# Patient Record
Sex: Female | Born: 1970 | Race: White | Hispanic: No | Marital: Single
Health system: Midwestern US, Academic
[De-identification: ages and names within clinical notes are randomized; demographics above are authoritative.]

---

## 2016-10-08 MED ORDER — PROPOFOL INJ 10 MG/ML IV VIAL
0 refills | Status: DC
Start: 2016-10-08 — End: 2016-10-08

## 2016-10-08 MED ORDER — MIDAZOLAM 1 MG/ML IJ SOLN
INTRAVENOUS | 0 refills | Status: DC
Start: 2016-10-08 — End: 2016-10-08

## 2016-10-08 MED ORDER — RP DX TC-99M SULF COLL MCI
.5 | Freq: Once | INTRAVENOUS | 0 refills | Status: CP
Start: 2016-10-08 — End: ?

## 2016-10-08 MED ORDER — LIDOCAINE (PF) 200 MG/10 ML (2 %) IJ SYRG
0 refills | Status: DC
Start: 2016-10-08 — End: 2016-10-08

## 2016-10-08 MED ORDER — FENTANYL CITRATE (PF) 50 MCG/ML IJ SOLN
0 refills | Status: DC
Start: 2016-10-08 — End: 2016-10-08

## 2016-10-08 MED ORDER — DEXAMETHASONE SODIUM PHOSPHATE 10 MG/ML IJ SOLN
0 refills | Status: DC
Start: 2016-10-08 — End: 2016-10-08

## 2016-10-08 MED ORDER — ONDANSETRON HCL (PF) 4 MG/2 ML IJ SOLN
0 refills | Status: DC
Start: 2016-10-08 — End: 2016-10-08

## 2016-10-08 MED ORDER — FAMOTIDINE (PF) 20 MG/2 ML IV SOLN
0 refills | Status: DC
Start: 2016-10-08 — End: 2016-10-08

## 2016-10-08 MED ORDER — DIPHENHYDRAMINE HCL 50 MG/ML IJ SOLN
0 refills | Status: DC
Start: 2016-10-08 — End: 2016-10-08

## 2016-10-18 MED ORDER — DIAZEPAM 5 MG PO TAB
5 mg | ORAL_TABLET | ORAL | 0 refills | 7.00000 days | Status: DC | PRN
Start: 2016-10-18 — End: 2016-10-30

## 2016-10-18 MED ORDER — OXYCODONE-ACETAMINOPHEN 5-325 MG PO TAB
1-2 | ORAL_TABLET | ORAL | 0 refills | 2.00000 days | Status: DC | PRN
Start: 2016-10-18 — End: 2016-10-30

## 2016-10-25 MED ORDER — TRASTUZUMAB IVPB
8 mg/kg | Freq: Once | INTRAVENOUS | 0 refills | Status: CN
Start: 2016-10-25 — End: ?

## 2016-10-25 MED ORDER — DEXAMETHASONE IVPB
12 mg | Freq: Once | INTRAVENOUS | 0 refills | Status: CN
Start: 2016-10-25 — End: ?

## 2016-10-25 MED ORDER — LIDOCAINE-PRILOCAINE 2.5-2.5 % TP CREA
1 refills | Status: DC
Start: 2016-10-25 — End: 2018-07-10

## 2016-10-25 MED ORDER — PALONOSETRON 0.25 MG/5 ML IV SOLN
.25 mg | Freq: Once | INTRAVENOUS | 0 refills | Status: CN
Start: 2016-10-25 — End: ?

## 2016-10-25 MED ORDER — LORAZEPAM 0.5 MG PO TAB
1 | ORAL_TABLET | ORAL | 0 refills | 12.00000 days | Status: DC | PRN
Start: 2016-10-25 — End: 2016-10-26

## 2016-10-25 MED ORDER — FOSAPREPITANT 150MG/150ML NS IVPB
150 mg | Freq: Once | INTRAVENOUS | 0 refills | Status: CN
Start: 2016-10-25 — End: ?

## 2016-10-25 MED ORDER — DEXAMETHASONE 4 MG PO TAB
8 mg | ORAL_TABLET | Freq: Two times a day (BID) | ORAL | 0 refills | 12.50000 days | Status: DC
Start: 2016-10-25 — End: 2017-01-04

## 2016-10-25 MED ORDER — DOCETAXEL IVPB
75 mg/m2 | Freq: Once | INTRAVENOUS | 0 refills | Status: CN
Start: 2016-10-25 — End: ?

## 2016-10-25 MED ORDER — PROCHLORPERAZINE MALEATE 10 MG PO TAB
10 mg | ORAL_TABLET | ORAL | 0 refills | Status: DC | PRN
Start: 2016-10-25 — End: 2017-03-29

## 2016-10-26 MED ORDER — LORAZEPAM 0.5 MG PO TAB
1 | ORAL_TABLET | ORAL | 0 refills | 12.00000 days | Status: DC | PRN
Start: 2016-10-26 — End: 2016-11-05

## 2016-10-30 MED ORDER — OXYCODONE-ACETAMINOPHEN 5-325 MG PO TAB
1-2 | ORAL_TABLET | ORAL | 0 refills | 2.00000 days | Status: DC | PRN
Start: 2016-10-30 — End: 2016-11-19

## 2016-10-30 MED ORDER — DIAZEPAM 5 MG PO TAB
5 mg | ORAL_TABLET | ORAL | 0 refills | 7.00000 days | Status: DC | PRN
Start: 2016-10-30 — End: 2016-11-19

## 2016-10-31 MED ORDER — HEPARIN, PORCINE (PF) 100 UNIT/ML IV SYRG
500 [IU] | Freq: Once | INTRAVENOUS | 0 refills | Status: DC
Start: 2016-10-31 — End: 2016-10-31

## 2016-11-02 MED ORDER — FOSAPREPITANT 150MG/150ML NS IVPB
150 mg | Freq: Once | INTRAVENOUS | 0 refills | Status: CP
Start: 2016-11-02 — End: ?

## 2016-11-02 MED ORDER — CARBOPLATIN IVPB (BY AUC-SWOG)
614.5 mg | Freq: Once | INTRAVENOUS | 0 refills | Status: CP
Start: 2016-11-02 — End: ?

## 2016-11-02 MED ORDER — TRASTUZUMAB IVPB
8 mg/kg | Freq: Once | INTRAVENOUS | 0 refills | Status: CP
Start: 2016-11-02 — End: ?

## 2016-11-02 MED ORDER — DEXAMETHASONE IVPB
12 mg | Freq: Once | INTRAVENOUS | 0 refills | Status: CP
Start: 2016-11-02 — End: ?

## 2016-11-02 MED ORDER — DOCETAXEL IVPB
75 mg/m2 | Freq: Once | INTRAVENOUS | 0 refills | Status: CP
Start: 2016-11-02 — End: ?

## 2016-11-02 MED ORDER — HEPARIN, PORCINE (PF) 100 UNIT/ML IV SYRG
500 [IU] | Freq: Once | INTRAVENOUS | 0 refills | Status: CP
Start: 2016-11-02 — End: ?

## 2016-11-02 MED ORDER — PEGFILGRASTIM 6 MG/0.6ML SC SYRG
6 mg | Freq: Once | SUBCUTANEOUS | 0 refills | Status: CP
Start: 2016-11-02 — End: ?

## 2016-11-02 MED ORDER — CARBOPLATIN IVPB (BY AUC-SWOG)
614.5 mg | Freq: Once | INTRAVENOUS | 0 refills | Status: CN
Start: 2016-11-02 — End: ?

## 2016-11-02 MED ORDER — PALONOSETRON 0.25 MG/5 ML IV SOLN
.25 mg | Freq: Once | INTRAVENOUS | 0 refills | Status: CP
Start: 2016-11-02 — End: ?

## 2016-11-05 MED ORDER — LORAZEPAM 1 MG PO TAB
1 mg | ORAL_TABLET | Freq: Every evening | ORAL | 0 refills | 12.00000 days | Status: DC | PRN
Start: 2016-11-05 — End: 2016-11-23

## 2016-11-19 MED ORDER — DIAZEPAM 5 MG PO TAB
5 mg | ORAL_TABLET | ORAL | 0 refills | 7.00000 days | Status: DC | PRN
Start: 2016-11-19 — End: 2017-02-13

## 2016-11-19 MED ORDER — OXYCODONE-ACETAMINOPHEN 5-325 MG PO TAB
1-2 | ORAL_TABLET | ORAL | 0 refills | 2.00000 days | Status: DC | PRN
Start: 2016-11-19 — End: 2017-01-25

## 2016-11-21 MED ORDER — DOCETAXEL IVPB
75 mg/m2 | Freq: Once | INTRAVENOUS | 0 refills | Status: CN
Start: 2016-11-21 — End: ?

## 2016-11-21 MED ORDER — CARBOPLATIN IVPB (BY AUC-SWOG)
614.5 mg | Freq: Once | INTRAVENOUS | 0 refills | Status: CN
Start: 2016-11-21 — End: ?

## 2016-11-21 MED ORDER — TRASTUZUMAB IVPB
6 mg/kg | Freq: Once | INTRAVENOUS | 0 refills | Status: CN
Start: 2016-11-21 — End: ?

## 2016-11-21 MED ORDER — FOSAPREPITANT 150MG/150ML NS IVPB
150 mg | Freq: Once | INTRAVENOUS | 0 refills | Status: CN
Start: 2016-11-21 — End: ?

## 2016-11-21 MED ORDER — DEXAMETHASONE IVPB
12 mg | Freq: Once | INTRAVENOUS | 0 refills | Status: CN
Start: 2016-11-21 — End: ?

## 2016-11-21 MED ORDER — PALONOSETRON 0.25 MG/5 ML IV SOLN
.25 mg | Freq: Once | INTRAVENOUS | 0 refills | Status: CN
Start: 2016-11-21 — End: ?

## 2016-11-23 MED ORDER — PALONOSETRON 0.25 MG/5 ML IV SOLN
.25 mg | Freq: Once | INTRAVENOUS | 0 refills | Status: CP
Start: 2016-11-23 — End: ?

## 2016-11-23 MED ORDER — PEGFILGRASTIM 6 MG/0.6ML SC SYRG
6 mg | Freq: Once | SUBCUTANEOUS | 0 refills | Status: CP
Start: 2016-11-23 — End: ?

## 2016-11-23 MED ORDER — FOSAPREPITANT 150MG/150ML NS IVPB
150 mg | Freq: Once | INTRAVENOUS | 0 refills | Status: CP
Start: 2016-11-23 — End: ?

## 2016-11-23 MED ORDER — TRASTUZUMAB IVPB
6 mg/kg | Freq: Once | INTRAVENOUS | 0 refills | Status: CP
Start: 2016-11-23 — End: ?

## 2016-11-23 MED ORDER — DOCETAXEL IVPB
75 mg/m2 | Freq: Once | INTRAVENOUS | 0 refills | Status: CP
Start: 2016-11-23 — End: ?

## 2016-11-23 MED ORDER — DEXAMETHASONE IVPB
12 mg | Freq: Once | INTRAVENOUS | 0 refills | Status: CP
Start: 2016-11-23 — End: ?

## 2016-11-23 MED ORDER — HEPARIN, PORCINE (PF) 100 UNIT/ML IV SYRG
500 [IU] | Freq: Once | INTRAVENOUS | 0 refills | Status: CP
Start: 2016-11-23 — End: ?

## 2016-11-23 MED ORDER — CARBOPLATIN IVPB (BY AUC-SWOG)
614.5 mg | Freq: Once | INTRAVENOUS | 0 refills | Status: CP
Start: 2016-11-23 — End: ?

## 2016-11-23 MED ORDER — LORAZEPAM 1 MG PO TAB
1 mg | ORAL_TABLET | Freq: Every evening | ORAL | 2 refills | 12.00000 days | Status: DC | PRN
Start: 2016-11-23 — End: 2016-12-06

## 2016-12-06 ENCOUNTER — Encounter: Admit: 2016-12-06 | Discharge: 2016-12-06 | Payer: 59

## 2016-12-06 MED ORDER — LORAZEPAM 1 MG PO TAB
1 mg | ORAL_TABLET | Freq: Every evening | ORAL | 2 refills | 12.00000 days | Status: AC | PRN
Start: 2016-12-06 — End: 2017-03-29

## 2016-12-06 NOTE — Telephone Encounter
Molly Randolph reports that she lost her prescription for lorazepam- she is requesting another prescription- she will pick it up on level 2 at [facility] on 12/06/2016

## 2016-12-14 ENCOUNTER — Encounter: Admit: 2016-12-14 | Discharge: 2016-12-14 | Payer: 59

## 2016-12-14 DIAGNOSIS — Z170 Estrogen receptor positive status [ER+]: ICD-10-CM

## 2016-12-14 DIAGNOSIS — C50811 Malignant neoplasm of overlapping sites of right female breast: ICD-10-CM

## 2016-12-14 DIAGNOSIS — C50112 Malignant neoplasm of central portion of left female breast: ICD-10-CM

## 2016-12-14 DIAGNOSIS — Z171 Estrogen receptor negative status [ER-]: ICD-10-CM

## 2016-12-14 DIAGNOSIS — C773 Secondary and unspecified malignant neoplasm of axilla and upper limb lymph nodes: ICD-10-CM

## 2016-12-14 DIAGNOSIS — M1990 Unspecified osteoarthritis, unspecified site: ICD-10-CM

## 2016-12-14 DIAGNOSIS — Z789 Other specified health status: ICD-10-CM

## 2016-12-14 DIAGNOSIS — C50919 Malignant neoplasm of unspecified site of unspecified female breast: ICD-10-CM

## 2016-12-14 DIAGNOSIS — F4310 Post-traumatic stress disorder, unspecified: ICD-10-CM

## 2016-12-14 DIAGNOSIS — C50812 Malignant neoplasm of overlapping sites of left female breast: Principal | ICD-10-CM

## 2016-12-14 DIAGNOSIS — Z5111 Encounter for antineoplastic chemotherapy: ICD-10-CM

## 2016-12-14 DIAGNOSIS — M797 Fibromyalgia: Principal | ICD-10-CM

## 2016-12-14 DIAGNOSIS — F329 Major depressive disorder, single episode, unspecified: ICD-10-CM

## 2016-12-14 LAB — COMPREHENSIVE METABOLIC PANEL
Lab: 0.4 mg/dL (ref 0.4–1.00)
Lab: 0.5 mg/dL (ref 0.3–1.2)
Lab: 117 mg/dL — ABNORMAL HIGH (ref 70–100)
Lab: 138 MMOL/L — ABNORMAL LOW (ref 137–147)
Lab: 16 U/L (ref 7–40)
Lab: 3.9 MMOL/L — ABNORMAL LOW (ref 3.5–5.1)
Lab: 56 U/L — ABNORMAL LOW (ref 25–110)
Lab: 6.9 g/dL (ref 6.0–8.0)
Lab: 9.8 mg/dL — ABNORMAL HIGH (ref 8.5–10.6)

## 2016-12-14 LAB — CBC AND DIFF
Lab: 0 10*3/uL (ref 0–0.20)
Lab: 0 10*3/uL (ref 0–0.45)
Lab: 7 10*3/uL — ABNORMAL LOW (ref 4.5–11.0)

## 2016-12-14 MED ORDER — FOSAPREPITANT 150MG/150ML NS IVPB
150 mg | Freq: Once | INTRAVENOUS | 0 refills | Status: CP
Start: 2016-12-14 — End: ?
  Administered 2016-12-14 (×2): 150 mg via INTRAVENOUS

## 2016-12-14 MED ORDER — DEXAMETHASONE IVPB
12 mg | Freq: Once | INTRAVENOUS | 0 refills | Status: CP
Start: 2016-12-14 — End: ?
  Administered 2016-12-14 (×2): 12 mg via INTRAVENOUS

## 2016-12-14 MED ORDER — HEPARIN, PORCINE (PF) 100 UNIT/ML IV SYRG
500 [IU] | Freq: Once | INTRAVENOUS | 0 refills | Status: CP
Start: 2016-12-14 — End: ?
  Administered 2016-12-14: 19:00:00 500 [IU] via INTRAVENOUS

## 2016-12-14 MED ORDER — PALONOSETRON 0.25 MG/5 ML IV SOLN
.25 mg | Freq: Once | INTRAVENOUS | 0 refills | Status: CP
Start: 2016-12-14 — End: ?
  Administered 2016-12-14: 16:00:00 0.25 mg via INTRAVENOUS

## 2016-12-14 MED ORDER — IBUPROFEN 600 MG PO TAB
600 mg | ORAL_TABLET | ORAL | 0 refills | Status: SS | PRN
Start: 2016-12-14 — End: 2017-02-13

## 2016-12-14 MED ORDER — IBUPROFEN 200 MG PO TAB
600 mg | Freq: Once | ORAL | 0 refills | Status: CP
Start: 2016-12-14 — End: ?
  Administered 2016-12-14: 18:00:00 600 mg via ORAL

## 2016-12-14 MED ORDER — TRASTUZUMAB IVPB
6 mg/kg | Freq: Once | INTRAVENOUS | 0 refills | Status: CP
Start: 2016-12-14 — End: ?
  Administered 2016-12-14 (×2): 408.7 mg via INTRAVENOUS

## 2016-12-14 MED ORDER — PEGFILGRASTIM 6 MG/0.6ML SC SYRG
6 mg | Freq: Once | SUBCUTANEOUS | 0 refills | Status: CP
Start: 2016-12-14 — End: ?
  Administered 2016-12-14: 19:00:00 6 mg via SUBCUTANEOUS

## 2016-12-14 MED ORDER — CARBOPLATIN IVPB (BY AUC-SWOG)
614.5 mg | Freq: Once | INTRAVENOUS | 0 refills | Status: CP
Start: 2016-12-14 — End: ?
  Administered 2016-12-14 (×2): 614.5 mg via INTRAVENOUS

## 2016-12-14 MED ORDER — DOCETAXEL IVPB
75 mg/m2 | Freq: Once | INTRAVENOUS | 0 refills | Status: CP
Start: 2016-12-14 — End: ?
  Administered 2016-12-14 (×2): 132.8 mg via INTRAVENOUS

## 2016-12-14 NOTE — Progress Notes
Plan: financial resources    Intervention: SW notified by RN, Catha Nottingham, of pt having questions regarding [employer] or other financial resources. SW met with pt in tx; pt's SO present for discussion. Pt was unsure what [employer] was and does not feel comfortable asking others for donations such as a [employer] SW explained other breast grants that pt can apply to; most are based on income. Pt states she is forcing herself to work but finds it difficult. Pt reports she has to work so she keeps her insurance. 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 Pt states with car loans and other expenses, pt doesn't have much left after bills are paid. Discussed that pt will likely have to provide income verification with some of the grants. Pt resides with her SO and his dgt 50% of the time. Pt has two children, one is an adult. Pt has a son who resides with his father and visits on the weekends.   Discussed pt feeling depressed with gaining weight, losing her hair, and not being as active as she'd like. SW explained Psychology services should pt be interested in getting an apt. Pt declined Psychology services at this time.   SW provided business card and will continue to remain available should further needs arise.     [place], LMSW

## 2016-12-14 NOTE — Progress Notes
Date of Service: 12/14/2016      Subjective:             Reason for Visit:  Heme/Onc Care      Molly Randolph is a 46 y.o. female.    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 In October 2015, she first noticed a depressed area superior to her left nipple, but thought that the finding was due to the weight loss and change in breast size. A month later, she noticed a mass in the same area. Bilateral diagnostic mammograms 06/07/14 ([place]) showed heterogeneously dense breast tissue. A spiculated mass was seen in the right breast 10-11:00 position, 2 cm FTN, anterior depth. There was an adjacent area architectural distortion seen only on CC tomosynthesis slice 39/64, slightly medial near 12:00, in the anterior central right breast. There were no suspicious masses, calcifications or architectural distortions in the left breast.  Right breast ultrasound 06/07/14 ([place]) showed a bilobed hypoechoic, irregular spiculated mass in the right breast extending from 11-12:00, 2 cm FTN with the dominant portion measuring 1.9 cm and a total inclusive distance of 3.4 cm. Right axillary survey demonstrates no suspicious adenopathy. BIRADS 5. On 07/05/14 she underwent a right breast biopsy that showed ILC, grade I, ER 90%, PR 98%, HER2 1+ IHC, Ki67 3%. On 08/04/14 Dr Nelson Chimes performed a right mastectomy and SLNB. Dr Fran Lowes placed a tissue expanders. Pathology showed ILC, grade 2, measuring 4.7cm.     She was seen on 09/11/16 for a new left breast mass. Left diagnostic mammogram 09/13/16 ([place]) revealed the area palpable concern as identified by the patient at 3:00, approximately 6-7 cm the nipple. At this location there was a subtle oval mass largely obscured by overlying glandular tissue. This measured approximately 1 cm but margins were poorly visualized. Further evaluation with ultrasound was performed. Targeted left breast ultrasound 09/13/16 ([place]) revealed at 3:00, 5 cm from nipple. At this location there was a lobulated hypoechoic mass with ill-defined margins measuring 1.3 cm x 0.8 cm x 1.1 cm. This was moderately suspicious and ultrasound-guided needle core biopsy was recommended. No axillary adenopathy was identified. On 09/14/16 left breast biopsy showed IDC, grade III, ER 0+, PR 0+, HER2 3+ IHC, Ki67 33%. She had left mastectomy and SLNB. Tumor was multifocal (1.7 cm, 0.2 cm, and multiple foci of microinvasion). 1/2 SLN was positive. ER/PR were 0% and HER2 was 3+ by IHC.     PRESENT THERAPY: Taxotere/Carbo/Herceptin.  She is here today for cycle #3 of TCH.      Review of Systems   Constitutional: Positive for fatigue.   HENT: Negative.    Eyes: Negative.    Respiratory: Negative.    Cardiovascular: Negative.    Gastrointestinal: Negative.    Genitourinary: Negative.         Menopausal Status:    Musculoskeletal: Negative.    Skin: Negative.    Neurological: Negative.    Hematological: Negative.    Psychiatric/Behavioral: Negative. All other systems reviewed and are negative.      No Known Allergies    Objective:         ??? ALPRAZolam (XANAX) 1 mg tablet Take 1 mg by mouth three times daily as needed for Anxiety.   ??? desvenlafaxine(+) (PRISTIQ) 50 mg tablet Take 50 mg by mouth daily.   ??? dexamethasone (DECADRON) 4 mg tablet Take 2 tablets by mouth twice daily. Take the day before, the day of, and the day after chemotherapy.   ??? dextroamphetamine-amphetamine(+) (ADDERALL) 30 mg tablet Take 30 mg by mouth twice daily   ??? diazePAM (VALIUM) 5 mg tablet Take 1 tablet by mouth every 6 hours as needed (muscle spasm).   ??? lidocaine/prilocaine (EMLA) 2.5/2.5 % topical cream Apply nickel size amount to port access site. Cover with plastic for 30-45 minutes. Wipe clean prior to accessing port   ??? LORazepam (ATIVAN) 1 mg tablet Take 1 tablet by mouth at bedtime as needed for Nausea, Vomiting or Other....   ??? oxyCODONE/acetaminophen (PERCOCET; ENDOCET; ROXICET) 5/325 mg tablet Take 1-2 tablets by mouth every 4 hours as needed for Pain   ??? prochlorperazine maleate (COMPAZINE) 10 mg tablet Take 1 tablet by mouth every 6 hours as needed.   ??? senna/docusate (SENOKOT-S) 8.6/50 mg tablet Take 1 tablet by mouth twice daily. To prevent constipation while taking pain medication.       Body mass index is 25.62 kg/m???.     Pain Score: Zero         Pain Addressed:  N/A    Patient Evaluated for a Clinical Trial: No treatment clinical trial available for this patient.     [place] Cooperative Oncology Group performance status is 1, Restricted in physically strenuous activity but ambulatory and able to carry out work of a light or sedentary nature, e.g., light house work, office work.     Physical Exam   Constitutional: She is oriented to person, place, and time. She appears well-developed and well-nourished.   HENT:   Head: Normocephalic.   Nose: Nose normal.   Mouth/Throat: No oropharyngeal exudate.   Eyes: Conjunctivae are normal. Pupils are equal, round, and reactive to light.   Neck: Normal range of motion. Neck supple.   Cardiovascular: Normal rate, regular rhythm and normal heart sounds.    No murmur heard.  Pulmonary/Chest: Effort normal and breath sounds normal. No respiratory distress. She has no wheezes. She has no rales. She exhibits no tenderness. Right breast exhibits no inverted nipple, no mass, no nipple discharge, no skin change and no tenderness. Left breast exhibits no inverted nipple, no mass, no nipple discharge, no skin change and no tenderness.       Abdominal: Soft. Bowel sounds are normal. She exhibits no distension and no mass. There is no tenderness. There is no rebound and no guarding.   Musculoskeletal: Normal range of motion. She exhibits no edema or tenderness.   Lymphadenopathy:     She has no cervical adenopathy.  She has no axillary adenopathy.        Right: No supraclavicular adenopathy present.        Left: No supraclavicular adenopathy present.   Neurological: She is alert and oriented to person, place, and time.   Skin: Skin is warm and dry. No rash noted.   Psychiatric: She has a normal mood and affect. Her behavior is normal.   Nursing note and vitals reviewed.       LABS/RADIOLOGY:  CBC w/Diff    Lab Results   Component Value Date/Time    WBC 7.0 12/14/2016 09:21 AM    RBC 3.42 (L) 12/14/2016 09:21 AM    HGB 11.3 (L) 12/14/2016 09:21 AM    HCT 32.3 (L) 12/14/2016 09:21 AM    MCV 94.4 12/14/2016 09:21 AM    MCH 33.2 12/14/2016 09:21 AM    MCHC 35.1 12/14/2016 09:21 AM    RDW 15.3 (H) 12/14/2016 09:21 AM    PLTCT 384 12/14/2016 09:21 AM    MPV 7.9 12/14/2016 09:21 AM    Lab Results   Component Value Date/Time    NEUT 80 (H) 12/14/2016 09:21 AM    ANC 5.50 12/14/2016 09:21 AM    LYMA 12 (L) 12/14/2016 09:21 AM    ALC 0.90 (L) 12/14/2016 09:21 AM    MONA 8 12/14/2016 09:21 AM    AMC 0.60 12/14/2016 09:21 AM    EOSA 0 12/14/2016 09:21 AM    AEC 0.00 12/14/2016 09:21 AM    BASA 0 12/14/2016 09:21 AM    ABC 0.00 12/14/2016 09:21 AM Comprehensive Metabolic Profile    Lab Results   Component Value Date/Time    NA 138 12/14/2016 09:21 AM    K 3.9 12/14/2016 09:21 AM    CL 105 12/14/2016 09:21 AM    CO2 26 12/14/2016 09:21 AM    GAP 7 12/14/2016 09:21 AM    BUN 13 12/14/2016 09:21 AM    CR 0.49 12/14/2016 09:21 AM    GLU 117 (H) 12/14/2016 09:21 AM    Lab Results   Component Value Date/Time    CA 9.8 12/14/2016 09:21 AM    ALBUMIN 4.3 12/14/2016 09:21 AM    TOTPROT 6.9 12/14/2016 09:21 AM    ALKPHOS 56 12/14/2016 09:21 AM    AST 16 12/14/2016 09:21 AM    ALT 15 12/14/2016 09:21 AM    TOTBILI 0.5 12/14/2016 09:21 AM    GFR >60 12/14/2016 09:21 AM    GFRAA >60 12/14/2016 09:21 AM      Labs reviewed.       ECHO 10/31/16: EF 65%       Assessment and Plan:    1. Molly Fiallo is a 46 year old post menopausal female with right breast ILC, grade I-II, measuring 4.7cm, ER/PR+ and HER2 negative.   2  Oncotype Dx RS was 10. The risk of 10 year distant recrrence on Tamoxifen is estimated to be 7%. Chemotherapy may provide an additional benefit of about 0% in terms to risk of recurrence. Chemotherapy was not recommended. She was started on Tamoxifen 20 mg 08/2014. She took tamoxifen off an on.   3. New left breast cancer 08/2016. On 09/14/16 left breast biopsy showed IDC, grade III, ER 0+, PR 0+, HER2 3+ IHC, Ki67 33%.  4. Left mastectomy and SLNB for a 1.7 cm cancer. I/2 SLN was positive.   5. Given the size and aggressive biology of the cancer, including HER2 positivity, we have recommended systemic chemotherapy.  I have recommended a  treatment regimen of Taxotere, Carboplatin, and Herceptin to be given every three weeks for six cycles.  This will be followed by Herceptin to complete a total treatment duration of 52 weeks.   6. She will receive cycle #3 of chemotherapy today.   7. We will request an ECHO every 3 months while on Herceptin; repeat 01/2017.    8. She stopped Tamoxifen when she started chemotherapy and then when chemotherapy is completed she will restart Tamoxifen.   9. RTC in 3 weeks with labs and chemotherapy.     Marygrace Drought, MD

## 2016-12-14 NOTE — Progress Notes
Pt c/o slight headache. TO per Dr. Humphrey Rolls at 1300 to give 600mg  ibuprofen for this.    CHEMO NOTE  Verified chemo consent signed and in chart.    Verified initiate chemo order in O2    Blood return positive via: Port (Single)    BSA and dose double checked (agree with orders as written) with: yes See MAR    Labs/applicable tests checked: CBC and Comprehensive Metabolic Panel (CMP)    Chemo regime: Drug/cycle/day    CARBOplatin (PARAPLATIN) 614.5 mg in sodium chloride 0.9% (NS) 311.45 mL IVPB (AUC-SWOG)     DOCEtaxel (TAXOTERE) 132.8 mg in sodium chloride 0.9% (NS) 263.28 mL IVPB (non-PVC)     trastuzumab (HERCEPTIN) 408.7 mg in sodium chloride 0.9% (NS) 563.8937 mL IVPB         Rate verified and armband double checkwith second RN: yes    Patient education offered and stated understanding. Denies questions at this time.    Pt to treament alert, oriented, ambulatory with sig other. Pt tolerated premeds and infusions without complication. Pt tolerated Neulasta INJ without complication. Brisk blood return from port prior to and following infusion. Port deaccessed, denies need for AVS, alert, oriented, ambulatory to lobby with sig other

## 2016-12-20 ENCOUNTER — Encounter: Admit: 2016-12-20 | Discharge: 2016-12-20 | Payer: 59

## 2016-12-20 ENCOUNTER — Emergency Department: Admit: 2016-12-20 | Discharge: 2016-12-20 | Payer: 59

## 2016-12-20 ENCOUNTER — Emergency Department: Admit: 2016-12-20 | Discharge: 2016-12-20 | Disposition: A | Discharge status: Home / Self Care | Payer: 59

## 2016-12-20 DIAGNOSIS — C50919 Malignant neoplasm of unspecified site of unspecified female breast: ICD-10-CM

## 2016-12-20 DIAGNOSIS — Z9221 Personal history of antineoplastic chemotherapy: ICD-10-CM

## 2016-12-20 DIAGNOSIS — K5900 Constipation, unspecified: Principal | ICD-10-CM

## 2016-12-20 LAB — CBC AND DIFF
Lab: 0 10*3/uL (ref 0–0.20)
Lab: 0.2 10*3/uL (ref 0–0.45)
Lab: 9.1 10*3/uL (ref 4.5–11.0)

## 2016-12-20 LAB — URINALYSIS DIPSTICK
Lab: NEGATIVE 10*3/uL (ref 3–12)
Lab: NEGATIVE MMOL/L (ref 21–30)
Lab: NEGATIVE U/L — ABNORMAL LOW (ref 25–110)
Lab: NEGATIVE g/dL (ref 3.5–5.0)
Lab: NEGATIVE mg/dL (ref 0.3–1.2)

## 2016-12-20 LAB — URINALYSIS, MICROSCOPIC

## 2016-12-20 LAB — COMPREHENSIVE METABOLIC PANEL
Lab: 133 MMOL/L — ABNORMAL LOW (ref 137–147)
Lab: >60 mL/min (ref >60)
Lab: >60 mL/min (ref >60)

## 2016-12-20 LAB — POC LACTATE: Lab: 1.7 MMOL/L (ref 0.5–2.0)

## 2016-12-20 LAB — LIPASE: Lab: 18 U/L (ref 11–82)

## 2016-12-20 MED ORDER — SODIUM CHLORIDE 0.9 % IJ SOLN
50 mL | Freq: Once | INTRAVENOUS | 0 refills | Status: CP
Start: 2016-12-20 — End: ?
  Administered 2016-12-20: 18:00:00 50 mL via INTRAVENOUS

## 2016-12-20 MED ORDER — SODIUM CHLORIDE 0.9 % IV SOLP
1000 mL | INTRAVENOUS | 0 refills | Status: CP
Start: 2016-12-20 — End: ?
  Administered 2016-12-20: 15:00:00 1000 mL via INTRAVENOUS

## 2016-12-20 MED ORDER — SODIUM PHOSPHATES 19-7 GRAM/118 ML RE ENEM
1 | Freq: Once | RECTAL | 1 refills | Status: AC
Start: 2016-12-20 — End: ?

## 2016-12-20 MED ORDER — IOPAMIDOL 76 % IV SOLN
80 mL | Freq: Once | INTRAVENOUS | 0 refills | Status: CP
Start: 2016-12-20 — End: ?
  Administered 2016-12-20: 18:00:00 80 mL via INTRAVENOUS

## 2016-12-20 MED ORDER — ACETAMINOPHEN/LIDOCAINE/ANTACID DS(#) 1:1:3  PO SUSP
30 mL | Freq: Once | ORAL | 0 refills | Status: CP
Start: 2016-12-20 — End: ?
  Administered 2016-12-20: 15:00:00 30 mL via ORAL

## 2016-12-20 MED ORDER — MAGNESIUM CITRATE PO SOLN
296 mL | Freq: Once | ORAL | 0 refills | 30.00000 days | Status: AC
Start: 2016-12-20 — End: ?

## 2016-12-20 MED ORDER — DOCUSATE SODIUM 100 MG PO CAP
100 mg | ORAL_CAPSULE | Freq: Two times a day (BID) | ORAL | 0 refills | Status: AC
Start: 2016-12-20 — End: 2017-02-15

## 2016-12-20 NOTE — Telephone Encounter
Patient called with new onset abdominal pain - generalized. This started couple of days ago and has prevented her from continuing to work. Her last bowel movement was 4 days ago. She has tried stool softeners and that has not helped her at all. She is very uncomfortable and appears to be in distress over the phone. I recommended evaluation at an urgent care or ER.

## 2016-12-20 NOTE — ED Notes
Pt states significant relief of pain.

## 2016-12-20 NOTE — ED Notes
Pt comes to the ED complaining of 4 day Hx of constipation and abdominal pain. Pt states that she has sharp LLQ pain and burning pain in epigastric and midline abdominal. She states that it feels like a ball is going down. Pt had one episode of vomiting which is not typical for her. Has Hx of constipation. Took stool softener yesterday. Has not been taking her oxycodone.  Pt underwent chemo 6/15. Denies fever, chills, bleeding.    Shirt, sweatshirt, kept at bedside with purse, wallet with ID/cards, phone. Shorts remain on. Pt does not wish to store belongings at this time.

## 2016-12-20 NOTE — ED Notes
Pt verbalizes understanding of discharge instructions and has no further questions.

## 2017-01-04 ENCOUNTER — Encounter: Admit: 2017-01-04 | Discharge: 2017-01-04 | Payer: 59

## 2017-01-04 DIAGNOSIS — C50812 Malignant neoplasm of overlapping sites of left female breast: ICD-10-CM

## 2017-01-04 DIAGNOSIS — C50811 Malignant neoplasm of overlapping sites of right female breast: Principal | ICD-10-CM

## 2017-01-04 DIAGNOSIS — Z171 Estrogen receptor negative status [ER-]: ICD-10-CM

## 2017-01-04 DIAGNOSIS — C773 Secondary and unspecified malignant neoplasm of axilla and upper limb lymph nodes: ICD-10-CM

## 2017-01-04 DIAGNOSIS — C50911 Malignant neoplasm of unspecified site of right female breast: ICD-10-CM

## 2017-01-04 DIAGNOSIS — Z170 Estrogen receptor positive status [ER+]: ICD-10-CM

## 2017-01-04 DIAGNOSIS — Z5111 Encounter for antineoplastic chemotherapy: ICD-10-CM

## 2017-01-04 LAB — CBC AND DIFF
Lab: 0 10*3/uL (ref 0–0.20)
Lab: 0 10*3/uL (ref 0–0.45)
Lab: 4.9 10*3/uL (ref 4.5–11.0)

## 2017-01-04 LAB — COMPREHENSIVE METABOLIC PANEL
Lab: 0.3 mg/dL (ref 0.3–1.2)
Lab: 0.6 mg/dL (ref 0.4–1.00)
Lab: 109 MMOL/L — ABNORMAL LOW (ref 98–110)
Lab: 12 mg/dL (ref 7–25)
Lab: 140 MMOL/L — ABNORMAL LOW (ref 137–147)
Lab: 23 MMOL/L (ref 21–30)
Lab: 25 U/L — ABNORMAL LOW (ref 7–40)
Lab: 252 mg/dL — ABNORMAL HIGH (ref 70–100)
Lab: 3.7 g/dL — ABNORMAL HIGH (ref 3.5–5.0)
Lab: 31 U/L (ref 7–56)
Lab: 4.1 MMOL/L — ABNORMAL LOW (ref 3.5–5.1)
Lab: 6.3 g/dL (ref 6.0–8.0)
Lab: 60 U/L — ABNORMAL LOW (ref 25–110)
Lab: 8 10*3/uL (ref 3–12)
Lab: 9 mg/dL — ABNORMAL HIGH (ref 8.5–10.6)
Lab: >60 mL/min (ref >60)
Lab: >60 mL/min — ABNORMAL LOW (ref >60)

## 2017-01-04 MED ORDER — DEXAMETHASONE IVPB
12 mg | Freq: Once | INTRAVENOUS | 0 refills | Status: CP
Start: 2017-01-04 — End: ?
  Administered 2017-01-04: 18:00:00 12 mg via INTRAVENOUS

## 2017-01-04 MED ORDER — DOCETAXEL IVPB
75 mg/m2 | Freq: Once | INTRAVENOUS | 0 refills | Status: CP
Start: 2017-01-04 — End: ?
  Administered 2017-01-04 (×2): 132.8 mg via INTRAVENOUS

## 2017-01-04 MED ORDER — CARBOPLATIN IVPB (BY AUC-SWOG)
614.5 mg | Freq: Once | INTRAVENOUS | 0 refills | Status: CP
Start: 2017-01-04 — End: ?
  Administered 2017-01-04 (×2): 614.5 mg via INTRAVENOUS

## 2017-01-04 MED ORDER — DEXAMETHASONE 4 MG PO TAB
8 mg | ORAL_TABLET | Freq: Two times a day (BID) | ORAL | 0 refills | 12.50000 days | Status: AC
Start: 2017-01-04 — End: 2017-03-29

## 2017-01-04 MED ORDER — PALONOSETRON 0.25 MG/5 ML IV SOLN
.25 mg | Freq: Once | INTRAVENOUS | 0 refills | Status: CP
Start: 2017-01-04 — End: ?
  Administered 2017-01-04: 18:00:00 0.25 mg via INTRAVENOUS

## 2017-01-04 MED ORDER — HEPARIN, PORCINE (PF) 100 UNIT/ML IV SYRG
500 [IU] | Freq: Once | INTRAVENOUS | 0 refills | Status: CP
Start: 2017-01-04 — End: ?
  Administered 2017-01-04: 21:00:00 500 [IU] via INTRAVENOUS

## 2017-01-04 MED ORDER — FOSAPREPITANT 150MG/150ML NS IVPB
150 mg | Freq: Once | INTRAVENOUS | 0 refills | Status: CP
Start: 2017-01-04 — End: ?
  Administered 2017-01-04: 18:00:00 150 mg via INTRAVENOUS

## 2017-01-04 MED ORDER — TRASTUZUMAB IVPB
6 mg/kg | Freq: Once | INTRAVENOUS | 0 refills | Status: CP
Start: 2017-01-04 — End: ?
  Administered 2017-01-04 (×2): 408.7 mg via INTRAVENOUS

## 2017-01-04 MED ORDER — PEGFILGRASTIM 6 MG/0.6ML SC SYRG
6 mg | Freq: Once | SUBCUTANEOUS | 0 refills | Status: CP
Start: 2017-01-04 — End: ?
  Administered 2017-01-04: 21:00:00 6 mg via SUBCUTANEOUS

## 2017-01-04 NOTE — Progress Notes
Date of Service: 01/04/2017      Subjective:             Reason for Visit:  Heme/Onc Care      Molly Randolph is a 46 y.o. female.    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 In October 2015, she first noticed a depressed area superior to her left nipple, but thought that the finding was due to the weight loss and change in breast size. A month later, she noticed a mass in the same area. Bilateral diagnostic mammograms 06/07/14 ([place]) showed heterogeneously dense breast tissue. A spiculated mass was seen in the right breast 10-11:00 position, 2 cm FTN, anterior depth. There was an adjacent area architectural distortion seen only on CC tomosynthesis slice 39/64, slightly medial near 12:00, in the anterior central right breast. There were no suspicious masses, calcifications or architectural distortions in the left breast.  Right breast ultrasound 06/07/14 ([place]) showed a bilobed hypoechoic, irregular spiculated mass in the right breast extending from 11-12:00, 2 cm FTN with the dominant portion measuring 1.9 cm and a total inclusive distance of 3.4 cm. Right axillary survey demonstrates no suspicious adenopathy. BIRADS 5. On 07/05/14 she underwent a right breast biopsy that showed ILC, grade I, ER 90%, PR 98%, HER2 1+ IHC, Ki67 3%. On 08/04/14 Dr Nelson Chimes performed a right mastectomy and SLNB. Dr Fran Lowes placed a tissue expanders. Pathology showed ILC, grade 2, measuring 4.7cm.     She was seen on 09/11/16 for a new left breast mass. Left diagnostic mammogram 09/13/16 ([place]) revealed the area palpable concern as identified by the patient at 3:00, approximately 6-7 cm the nipple. At this location there was a subtle oval mass largely obscured by overlying glandular tissue. This measured approximately 1 cm but margins were poorly visualized. Further evaluation with ultrasound was performed. Targeted left breast ultrasound 09/13/16 () revealed at 3:00, 5 cm from nipple. At this location there was a lobulated hypoechoic mass with ill-defined margins measuring 1.3 cm x 0.8 cm x 1.1 cm. This was moderately suspicious and ultrasound-guided needle core biopsy was recommended. No axillary adenopathy was identified. On 09/14/16 left breast biopsy showed IDC, grade III, ER 0+, PR 0+, HER2 3+ IHC, Ki67 33%. She had left mastectomy and SLNB. Tumor was multifocal (1.7 cm, 0.2 cm, and multiple foci of microinvasion). 1/2 SLN was positive. ER/PR were 0% and HER2 was 3+ by IHC.     PRESENT THERAPY: Taxotere/Carbo/Herceptin.  She is here today for cycle #4 of TCH.   She went to the ED on 12/20/16 for constipation.      Review of Systems   Constitutional: Positive for fatigue and unexpected weight change (gain).   HENT: Negative.    Eyes: Negative.    Respiratory: Negative.    Cardiovascular: Negative.    Gastrointestinal: Positive for constipation.   Genitourinary: Negative.    Musculoskeletal: Negative.    Skin: Negative.    Neurological: Negative. Hematological: Negative.    Psychiatric/Behavioral: Negative.    All other systems reviewed and are negative.      No Known Allergies    Objective:         ??? ALPRAZolam (XANAX) 1 mg tablet Take 1 mg by mouth three times daily as needed for Anxiety.   ??? desvenlafaxine(+) (PRISTIQ) 50 mg tablet Take 50 mg by mouth daily.   ??? dexamethasone (DECADRON) 4 mg tablet Take 2 tablets by mouth twice daily. Take the day before, the day of, and the day after chemotherapy.   ??? dextroamphetamine-amphetamine(+) (ADDERALL) 30 mg tablet Take 30 mg by mouth twice daily   ??? diazePAM (VALIUM) 5 mg tablet Take 1 tablet by mouth every 6 hours as needed (muscle spasm).   ??? docusate (COLACE) 100 mg capsule Take 1 capsule by mouth twice daily.   ??? ibuprofen (MOTRIN) 600 mg tablet Take 1 tablet by mouth every 6 hours as needed for Pain. Take with food.   ??? lidocaine/prilocaine (EMLA) 2.5/2.5 % topical cream Apply nickel size amount to port access site. Cover with plastic for 30-45 minutes. Wipe clean prior to accessing port   ??? LORazepam (ATIVAN) 1 mg tablet Take 1 tablet by mouth at bedtime as needed for Nausea, Vomiting or Other....   ??? oxyCODONE/acetaminophen (PERCOCET; ENDOCET; ROXICET) 5/325 mg tablet Take 1-2 tablets by mouth every 4 hours as needed for Pain   ??? prochlorperazine maleate (COMPAZINE) 10 mg tablet Take 1 tablet by mouth every 6 hours as needed.   ??? senna/docusate (SENOKOT-S) 8.6/50 mg tablet Take 1 tablet by mouth twice daily. To prevent constipation while taking pain medication.       Body mass index is 27.59 kg/m???.     Pain Score: Zero         Pain Addressed:  N/A    Patient Evaluated for a Clinical Trial: No treatment clinical trial available for this patient.     [place] Cooperative Oncology Group performance status is 1, Restricted in physically strenuous activity but ambulatory and able to carry out work of a light or sedentary nature, e.g., light house work, office work.     Physical Exam Constitutional: She is oriented to person, place, and time. She appears well-developed and well-nourished.   HENT:   Head: Normocephalic.   Nose: Nose normal.   Mouth/Throat: No oropharyngeal exudate.   Eyes: Conjunctivae are normal. Pupils are equal, round, and reactive to light.   Neck: Normal range of motion. Neck supple.   Cardiovascular: Normal rate, regular rhythm and normal heart sounds.    No murmur heard.  Pulmonary/Chest: Effort normal and breath sounds normal. No respiratory distress. She has no wheezes. She has no rales. She exhibits no tenderness. Right breast exhibits no inverted nipple, no mass, no nipple discharge, no skin change and no tenderness. Left breast exhibits no inverted nipple, no mass, no nipple discharge, no skin change and no tenderness.       Abdominal: Soft. Bowel sounds are normal. She  exhibits no distension and no mass. There is no tenderness. There is no rebound and no guarding.   Musculoskeletal: Normal range of motion. She exhibits no edema or tenderness.   Lymphadenopathy:     She has no cervical adenopathy.     She has no axillary adenopathy.        Right: No supraclavicular adenopathy present.        Left: No supraclavicular adenopathy present.   Neurological: She is alert and oriented to person, place, and time.   Skin: Skin is warm and dry. No rash noted.   Psychiatric: She has a normal mood and affect. Her behavior is normal.   Nursing note and vitals reviewed.       LABS/RADIOLOGY:  CBC w/Diff    Lab Results   Component Value Date/Time    WBC 4.9 01/04/2017 10:14 AM    RBC 3.00 (L) 01/04/2017 10:14 AM    HGB 10.2 (L) 01/04/2017 10:14 AM    HCT 29.0 (L) 01/04/2017 10:14 AM    MCV 96.7 01/04/2017 10:14 AM    MCH 34.0 01/04/2017 10:14 AM    MCHC 35.1 01/04/2017 10:14 AM    RDW 16.5 (H) 01/04/2017 10:14 AM    PLTCT 317 01/04/2017 10:14 AM    MPV 7.7 01/04/2017 10:14 AM    Lab Results   Component Value Date/Time    NEUT 89 (H) 01/04/2017 10:14 AM ANC 4.40 01/04/2017 10:14 AM    LYMA 9 (L) 01/04/2017 10:14 AM    ALC 0.40 (L) 01/04/2017 10:14 AM    MONA 1 (L) 01/04/2017 10:14 AM    AMC 0.00 01/04/2017 10:14 AM    EOSA 0 01/04/2017 10:14 AM    AEC 0.00 01/04/2017 10:14 AM    BASA 1 01/04/2017 10:14 AM    ABC 0.00 01/04/2017 10:14 AM        Comprehensive Metabolic Profile    Lab Results   Component Value Date/Time    NA 140 01/04/2017 10:14 AM    K 4.1 01/04/2017 10:14 AM    CL 109 01/04/2017 10:14 AM    CO2 23 01/04/2017 10:14 AM    GAP 8 01/04/2017 10:14 AM    BUN 12 01/04/2017 10:14 AM    CR 0.68 01/04/2017 10:14 AM    GLU 252 (H) 01/04/2017 10:14 AM    Lab Results   Component Value Date/Time    CA 9.0 01/04/2017 10:14 AM    ALBUMIN 3.7 01/04/2017 10:14 AM    TOTPROT 6.3 01/04/2017 10:14 AM    ALKPHOS 60 01/04/2017 10:14 AM    AST 25 01/04/2017 10:14 AM    ALT 31 01/04/2017 10:14 AM    TOTBILI 0.3 01/04/2017 10:14 AM    GFR >60 01/04/2017 10:14 AM    GFRAA >60 01/04/2017 10:14 AM      Labs reviewed.       ECHO 10/31/16: EF 65%       Assessment and Plan:    1. Molly Randolph is a 46 year old post menopausal female with right breast ILC, grade I-II, measuring 4.7cm, ER/PR+ and HER2 negative.   2  Oncotype Dx RS was 10. The risk of 10 year distant recrrence on Tamoxifen is estimated to be 7%. Chemotherapy may provide an additional benefit of about 0% in terms to risk of recurrence. Chemotherapy was not recommended. She was started on Tamoxifen 20 mg 08/2014. She took tamoxifen off an on.   3. New left breast cancer 08/2016. On 09/14/16  left breast biopsy showed IDC, grade III, ER 0+, PR 0+, HER2 3+ IHC, Ki67 33%.  4. Left mastectomy and SLNB for a 1.7 cm cancer. I/2 SLN was positive.   5. Given the size and aggressive biology of the cancer, including HER2 positivity, we have recommended systemic chemotherapy.  I have recommended a  treatment regimen of Taxotere, Carboplatin, and Herceptin to be given every three weeks for six cycles.  This will be followed by Herceptin to complete a total treatment duration of 52 weeks.   6. She will receive cycle #4 of chemotherapy today.   7. We will request an ECHO every 3 months while on Herceptin; repeat 01/2017.    8. She stopped Tamoxifen when she started chemotherapy and then when chemotherapy is completed she will restart Tamoxifen.   9. Constipation; encouraged to drink fluids and use stool softeners and mag citrate.   10. Weight gain; encouraged exercise and healthy food choice.   11. RTC in 3 weeks with labs and chemotherapy.     Gershon Mussel, APRN    I have personally interviewed and examined the patient.  I have reviewed the history, physical, impression and plan outlined by Gershon Mussel NP.  HPI: The patient presents for continued breast cancer treatment.  On examination:  HEENT: No icterus, PERRL.  Oropharynx is normal; no exudate or lesions.  Neck: No JVD, supple.  Adenopathy: None.  Chest: CTA bilaterally.  CV: RRR without murmur.  Breasts:  Right: mastectomy   Left: mastectomy   Abdomen: Soft, non-distended,   Positive bowel sounds.  Skin: No rash.  Back: no pain with palpation over the T-spine.  Extremities: No clubbing or cyanosis.  Neuro: CN: II-XII intact; no sensory or motor abnormalities noted.   My impression: bilateral breast cancer  My plan: Continue with chemotherapy

## 2017-01-04 NOTE — Progress Notes
CHEMO NOTE  Verified chemo consent signed and in chart.    Verified initiate chemo order in O2    Blood return positive via: Port (Single and Accessed)    BSA and dose double checked (agree with orders as written) with: See MAR    Labs/applicable tests checked: CBC and Comprehensive Metabolic Panel (CMP)    Chemo regime: Herceptin/Taxotere/Carboplatin- Cycle4/Day1    Rate verified and armband double checkwith second RN: See [facility]    Patient education offered and stated understanding. Denies questions at this time.    Pt presents from exam where she was assessed by the provider to [facility] tx without complaints.  PAC already accessed- patent and with good blood return. Labs ok for tx and premeds started.   1320- Premeds complete. Herceptin started TRO 36mins.   1400- Herceptin complete. Taxotere started TRO 1hr.   1515- Taxotere complete. Carboplatin started TRO 52mins.   1600- Carboplatin complete. PAC flushed and deaccessed. Pt dc'd to home without complaints with daughter and ambulated from tx area.

## 2017-01-06 NOTE — Progress Notes
ATTESTATION    I personally performed the key portions of the E/M visit, discussed case with Stephanie Lafaver, ARNP and concur with her documentation of history, physical exam, assessment, and treatment plan unless otherwise noted.    Staff name:  Yassmin Binegar J Yonna Alwin, MD Date:  01/06/2017

## 2017-01-25 ENCOUNTER — Encounter: Admit: 2017-01-25 | Discharge: 2017-01-25 | Payer: 59

## 2017-01-25 DIAGNOSIS — M797 Fibromyalgia: Principal | ICD-10-CM

## 2017-01-25 DIAGNOSIS — K5900 Constipation, unspecified: ICD-10-CM

## 2017-01-25 DIAGNOSIS — C50811 Malignant neoplasm of overlapping sites of right female breast: ICD-10-CM

## 2017-01-25 DIAGNOSIS — F329 Major depressive disorder, single episode, unspecified: ICD-10-CM

## 2017-01-25 DIAGNOSIS — Z170 Estrogen receptor positive status [ER+]: ICD-10-CM

## 2017-01-25 DIAGNOSIS — C773 Secondary and unspecified malignant neoplasm of axilla and upper limb lymph nodes: ICD-10-CM

## 2017-01-25 DIAGNOSIS — M1990 Unspecified osteoarthritis, unspecified site: ICD-10-CM

## 2017-01-25 DIAGNOSIS — Z5111 Encounter for antineoplastic chemotherapy: Principal | ICD-10-CM

## 2017-01-25 DIAGNOSIS — F4310 Post-traumatic stress disorder, unspecified: ICD-10-CM

## 2017-01-25 DIAGNOSIS — C50911 Malignant neoplasm of unspecified site of right female breast: ICD-10-CM

## 2017-01-25 DIAGNOSIS — Z789 Other specified health status: ICD-10-CM

## 2017-01-25 DIAGNOSIS — C50919 Malignant neoplasm of unspecified site of unspecified female breast: ICD-10-CM

## 2017-01-25 DIAGNOSIS — C50812 Malignant neoplasm of overlapping sites of left female breast: ICD-10-CM

## 2017-01-25 LAB — COMPREHENSIVE METABOLIC PANEL
Lab: 0.4 mg/dL (ref 0.3–1.2)
Lab: 0.5 mg/dL (ref 0.4–1.00)
Lab: 104 MMOL/L — ABNORMAL LOW (ref 98–110)
Lab: 12 U/L (ref 7–56)
Lab: 126 mg/dL — ABNORMAL HIGH (ref 70–100)
Lab: 136 MMOL/L — ABNORMAL LOW (ref 137–147)
Lab: 16 U/L — ABNORMAL LOW (ref 7–40)
Lab: 17 mg/dL — ABNORMAL HIGH (ref 7–25)
Lab: 25 MMOL/L (ref 21–30)
Lab: 4.2 MMOL/L — ABNORMAL LOW (ref 3.5–5.1)
Lab: 4.2 g/dL — ABNORMAL HIGH (ref 3.5–5.0)
Lab: 59 U/L — ABNORMAL LOW (ref 25–110)
Lab: 7 10*3/uL (ref 3–12)
Lab: 7.1 g/dL (ref 6.0–8.0)
Lab: 9.9 mg/dL — ABNORMAL HIGH (ref 8.5–10.6)
Lab: >60 mL/min (ref >60)
Lab: >60 mL/min — ABNORMAL LOW (ref >60)

## 2017-01-25 LAB — CBC AND DIFF
Lab: 0 10*3/uL (ref 0–0.20)
Lab: 0 10*3/uL (ref 0–0.45)
Lab: 7.3 10*3/uL (ref 4.5–11.0)

## 2017-01-25 MED ORDER — HEPARIN, PORCINE (PF) 100 UNIT/ML IV SYRG
500 [IU] | Freq: Once | INTRAVENOUS | 0 refills | Status: CP
Start: 2017-01-25 — End: ?
  Administered 2017-01-25: 18:00:00 500 [IU] via INTRAVENOUS

## 2017-01-25 MED ORDER — PALONOSETRON 0.25 MG/5 ML IV SOLN
.25 mg | Freq: Once | INTRAVENOUS | 0 refills | Status: CP
Start: 2017-01-25 — End: ?
  Administered 2017-01-25: 15:00:00 0.25 mg via INTRAVENOUS

## 2017-01-25 MED ORDER — DEXAMETHASONE IVPB
12 mg | Freq: Once | INTRAVENOUS | 0 refills | Status: CP
Start: 2017-01-25 — End: ?
  Administered 2017-01-25 (×2): 12 mg via INTRAVENOUS

## 2017-01-25 MED ORDER — CARBOPLATIN IVPB (BY AUC-SWOG)
614.5 mg | Freq: Once | INTRAVENOUS | 0 refills | Status: CP
Start: 2017-01-25 — End: ?
  Administered 2017-01-25 (×2): 614.5 mg via INTRAVENOUS

## 2017-01-25 MED ORDER — TRASTUZUMAB IVPB
6 mg/kg | Freq: Once | INTRAVENOUS | 0 refills | Status: CP
Start: 2017-01-25 — End: ?
  Administered 2017-01-25 (×2): 408.7 mg via INTRAVENOUS

## 2017-01-25 MED ORDER — PEGFILGRASTIM 6 MG/0.6ML SC SYRG
6 mg | Freq: Once | SUBCUTANEOUS | 0 refills | Status: CP
Start: 2017-01-25 — End: ?
  Administered 2017-01-25: 18:00:00 6 mg via SUBCUTANEOUS

## 2017-01-25 MED ORDER — OXYCODONE-ACETAMINOPHEN 5-325 MG PO TAB
1-2 | ORAL_TABLET | ORAL | 0 refills | Status: SS | PRN
Start: 2017-01-25 — End: 2017-02-13

## 2017-01-25 MED ORDER — FOSAPREPITANT 150MG/150ML NS IVPB
150 mg | Freq: Once | INTRAVENOUS | 0 refills | Status: CP
Start: 2017-01-25 — End: ?
  Administered 2017-01-25 (×2): 150 mg via INTRAVENOUS

## 2017-01-25 MED ORDER — DOCETAXEL IVPB
75 mg/m2 | Freq: Once | INTRAVENOUS | 0 refills | Status: CP
Start: 2017-01-25 — End: ?
  Administered 2017-01-25 (×2): 132.8 mg via INTRAVENOUS

## 2017-01-25 NOTE — Progress Notes
Date of Service: 01/25/2017      Subjective:             Reason for Visit:  Heme/Onc Care      Molly Randolph is a 46 y.o. female.    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 In October 2015, she first noticed a depressed area superior to her left nipple, but thought that the finding was due to the weight loss and change in breast size. A month later, she noticed a mass in the same area. Bilateral diagnostic mammograms 06/07/14 ([place]) showed heterogeneously dense breast tissue. A spiculated mass was seen in the right breast 10-11:00 position, 2 cm FTN, anterior depth. There was an adjacent area architectural distortion seen only on CC tomosynthesis slice 39/64, slightly medial near 12:00, in the anterior central right breast. There were no suspicious masses, calcifications or architectural distortions in the left breast.  Right breast ultrasound 06/07/14 ([place]) showed a bilobed hypoechoic, irregular spiculated mass in the right breast extending from 11-12:00, 2 cm FTN with the dominant portion measuring 1.9 cm and a total inclusive distance of 3.4 cm. Right axillary survey demonstrates no suspicious adenopathy. BIRADS 5. On 07/05/14 she underwent a right breast biopsy that showed ILC, grade I, ER 90%, PR 98%, HER2 1+ IHC, Ki67 3%. On 08/04/14 Dr Nelson Chimes performed a right mastectomy and SLNB. Dr Fran Lowes placed a tissue expanders. Pathology showed ILC, grade 2, measuring 4.7cm.     She was seen on 09/11/16 for a new left breast mass. Left diagnostic mammogram 09/13/16 ([place]) revealed the area palpable concern as identified by the patient at 3:00, approximately 6-7 cm the nipple. At this location there was a subtle oval mass largely obscured by overlying glandular tissue. This measured approximately 1 cm but margins were poorly visualized. Further evaluation with ultrasound was performed. Targeted left breast ultrasound 09/13/16 ([place]) revealed at 3:00, 5 cm from nipple. At this location there was a lobulated hypoechoic mass with ill-defined margins measuring 1.3 cm x 0.8 cm x 1.1 cm. This was moderately suspicious and ultrasound-guided needle core biopsy was recommended. No axillary adenopathy was identified. On 09/14/16 left breast biopsy showed IDC, grade III, ER 0+, PR 0+, HER2 3+ IHC, Ki67 33%. She had left mastectomy and SLNB. Tumor was multifocal (1.7 cm, 0.2 cm, and multiple foci of microinvasion). 1/2 SLN was positive. ER/PR were 0% and HER2 was 3+ by IHC.     PRESENT THERAPY: Taxotere/Carbo/Herceptin.  She is here today for cycle #5 of TCH. The patient is at a high risk of developing chemotherapy neutropenia complications due to age/regimen. She will receive Neulasta 6mg  SC on day 1.   She went to the ED on 12/20/16 for constipation.      Review of Systems   Constitutional: Positive for fatigue and unexpected weight change (gain).   HENT: Negative.    Eyes: Negative.    Respiratory: Negative.    Cardiovascular: Negative. Gastrointestinal: Positive for constipation.   Genitourinary: Negative.    Musculoskeletal: Negative.    Skin: Negative.    Neurological: Negative.    Hematological: Negative.    Psychiatric/Behavioral: Negative.    All other systems reviewed and are negative.      No Known Allergies    Objective:         ??? ALPRAZolam (XANAX) 1 mg tablet Take 1 mg by mouth three times daily as needed for Anxiety.   ??? desvenlafaxine(+) (PRISTIQ) 50 mg tablet Take 50 mg by mouth daily.   ??? dexamethasone (DECADRON) 4 mg tablet Take 2 tablets by mouth twice daily. Take the day before, the day of, and the day after chemotherapy.   ??? dextroamphetamine-amphetamine(+) (ADDERALL) 30 mg tablet Take 30 mg by mouth twice daily   ??? diazePAM (VALIUM) 5 mg tablet Take 1 tablet by mouth every 6 hours as needed (muscle spasm).   ??? docusate (COLACE) 100 mg capsule Take 1 capsule by mouth twice daily.   ??? ibuprofen (MOTRIN) 600 mg tablet Take 1 tablet by mouth every 6 hours as needed for Pain. Take with food.   ??? lidocaine/prilocaine (EMLA) 2.5/2.5 % topical cream Apply nickel size amount to port access site. Cover with plastic for 30-45 minutes. Wipe clean prior to accessing port   ??? LORazepam (ATIVAN) 1 mg tablet Take 1 tablet by mouth at bedtime as needed for Nausea, Vomiting or Other....   ??? oxyCODONE/acetaminophen (PERCOCET; ENDOCET; ROXICET) 5/325 mg tablet Take 1-2 tablets by mouth every 4 hours as needed for Pain   ??? prochlorperazine maleate (COMPAZINE) 10 mg tablet Take 1 tablet by mouth every 6 hours as needed.   ??? senna/docusate (SENOKOT-S) 8.6/50 mg tablet Take 1 tablet by mouth twice daily. To prevent constipation while taking pain medication.       Body mass index is 26.89 kg/m???.     Pain Score: Zero         Pain Addressed:  N/A    Patient Evaluated for a Clinical Trial: No treatment clinical trial available for this patient.     [place] Cooperative Oncology Group performance status is 1, Restricted in physically strenuous activity but ambulatory and able to carry out work of a light or sedentary nature, e.g., light house work, office work.     Physical Exam   Constitutional: She is oriented to person, place, and time. She appears well-developed and well-nourished.   HENT:   Head: Normocephalic.   Nose: Nose normal.   Mouth/Throat: No oropharyngeal exudate.   Eyes: Conjunctivae are normal. Pupils are equal, round, and reactive to light.   Neck: Normal range of motion. Neck supple.   Cardiovascular: Normal rate, regular rhythm and normal heart sounds.    No murmur heard.  Pulmonary/Chest: Effort normal and breath sounds normal. No respiratory distress. She has no wheezes. She has no rales. She exhibits no tenderness. Right breast exhibits no inverted nipple, no mass, no nipple discharge, no skin change and no tenderness. Left breast exhibits no  inverted nipple, no mass, no nipple discharge, no skin change and no tenderness.       Abdominal: Soft. Bowel sounds are normal. She exhibits no distension and no mass. There is no tenderness. There is no rebound and no guarding.   Musculoskeletal: Normal range of motion. She exhibits no edema or tenderness.   Lymphadenopathy:     She has no cervical adenopathy.     She has no axillary adenopathy.        Right: No supraclavicular adenopathy present.        Left: No supraclavicular adenopathy present.   Neurological: She is alert and oriented to person, place, and time.   Skin: Skin is warm and dry. No rash noted.   Psychiatric: She has a normal mood and affect. Her behavior is normal.   Nursing note and vitals reviewed.       LABS/RADIOLOGY:  CBC w/Diff    Lab Results   Component Value Date/Time    WBC 7.3 01/25/2017 07:26 AM    RBC 3.05 (L) 01/25/2017 07:26 AM    HGB 10.6 (L) 01/25/2017 07:26 AM    HCT 30.0 (L) 01/25/2017 07:26 AM    MCV 98.4 01/25/2017 07:26 AM    MCH 34.8 (H) 01/25/2017 07:26 AM    MCHC 35.3 01/25/2017 07:26 AM    RDW 16.7 (H) 01/25/2017 07:26 AM PLTCT 327 01/25/2017 07:26 AM    MPV 7.7 01/25/2017 07:26 AM    Lab Results   Component Value Date/Time    NEUT 91 (H) 01/25/2017 07:26 AM    ANC 6.60 01/25/2017 07:26 AM    LYMA 7 (L) 01/25/2017 07:26 AM    ALC 0.50 (L) 01/25/2017 07:26 AM    MONA 2 (L) 01/25/2017 07:26 AM    AMC 0.10 01/25/2017 07:26 AM    EOSA 0 01/25/2017 07:26 AM    AEC 0.00 01/25/2017 07:26 AM    BASA 0 01/25/2017 07:26 AM    ABC 0.00 01/25/2017 07:26 AM        Comprehensive Metabolic Profile    Lab Results   Component Value Date/Time    NA 136 (L) 01/25/2017 07:26 AM    K 4.2 01/25/2017 07:26 AM    CL 104 01/25/2017 07:26 AM    CO2 25 01/25/2017 07:26 AM    GAP 7 01/25/2017 07:26 AM    BUN 17 01/25/2017 07:26 AM    CR 0.57 01/25/2017 07:26 AM    GLU 126 (H) 01/25/2017 07:26 AM    Lab Results   Component Value Date/Time    CA 9.9 01/25/2017 07:26 AM    ALBUMIN 4.2 01/25/2017 07:26 AM    TOTPROT 7.1 01/25/2017 07:26 AM    ALKPHOS 59 01/25/2017 07:26 AM    AST 16 01/25/2017 07:26 AM    ALT 12 01/25/2017 07:26 AM    TOTBILI 0.4 01/25/2017 07:26 AM    GFR >60 01/25/2017 07:26 AM    GFRAA >60 01/25/2017 07:26 AM        ECHO 10/31/16: EF 65%       Assessment and Plan:    1. 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   2  Oncotype Dx RS was 10. The risk of 10 year distant recrrence on Tamoxifen is estimated to be 7%. Chemotherapy may provide an additional benefit of about 0% in terms to risk of recurrence. Chemotherapy was not recommended. She was  started on Tamoxifen 20 mg 08/2014. She took tamoxifen off an on.   3. New left breast cancer 08/2016. On 09/14/16 left breast biopsy showed IDC, grade III, ER 0+, PR 0+, HER2 3+ IHC, Ki67 33%.  4. Left mastectomy and SLNB for a 1.7 cm cancer. I/2 SLN was positive.   5. Given the size and aggressive biology of the cancer, including HER2 positivity, we have recommended systemic chemotherapy.  I have recommended a  treatment regimen of Taxotere, Carboplatin, and Herceptin to be given every three weeks for six cycles.  This will be followed by Herceptin to complete a total treatment duration of 52 weeks.   6. She will receive cycle #5 of chemotherapy today.   7. We will request an ECHO every 3 months while on Herceptin; repeat 01/2017.    8. She stopped Tamoxifen when she started chemotherapy and then when chemotherapy is completed she will restart Tamoxifen.   9. Constipation; encouraged to drink fluids and use stool softeners and mag citrate.   10. Weight gain; encouraged exercise and healthy food choice.   11. RTC in 3 weeks with labs and chemotherapy.     Gershon Mussel, APRN

## 2017-01-25 NOTE — Progress Notes
CHEMO NOTE  Verified chemo consent signed and in chart.    Verified initiate chemo order in O2    Blood return positive via: Port (Single)    BSA and dose double checked (agree with orders as written) with: yes See MAR    Labs/applicable tests checked: CBC and Comprehensive Metabolic Panel (CMP)    Chemo regime: Drug/cycle/day    trastuzumab (HERCEPTIN) 408.7 mg in sodium chloride 0.9% (NS) 948.5462 mL IVPB     DOCEtaxel (TAXOTERE) 132.8 mg in sodium chloride 0.9% (NS) 263.28 mL IVPB    CARBOplatin (PARAPLATIN) 614.5 mg in sodium chloride 0.9% (NS) 311.45 mL IVPB    Rate verified and armband double checkwith second RN: yes    Patient education offered and stated understanding. Denies questions at this time.    Patient arrived to CC treatment for C5D1 Herceptin, Taxotere,Carboplatin. Pt seen in clinic today by Delsa Bern. Pt reports all needs addressed by provider, denies new concerns at this time. PAC accessed in lab, flushes, with positive blood return noted. Parameters met for treatment. Premedications and chemotherapy initiated per treatment plan. Neulasta given per MAR. Treatment completed, pt tolerated w/o adverse event. PAC flushed and de-accessed. Pt left treatment ambulatory accompanied by significant other w/o further questions or concerns.

## 2017-02-12 MED ORDER — ACETAMINOPHEN 325 MG PO TAB
650 mg | Freq: Once | ORAL | 0 refills | Status: CP
Start: 2017-02-12 — End: ?
  Administered 2017-02-13: 07:00:00 650 mg via ORAL

## 2017-02-12 MED ORDER — LACTATED RINGERS IV SOLP
500 mL | INTRAVENOUS | 0 refills | Status: CP
Start: 2017-02-12 — End: ?
  Administered 2017-02-13: 07:00:00 500 mL via INTRAVENOUS

## 2017-02-13 ENCOUNTER — Observation Stay: Admit: 2017-02-13 | Discharge: 2017-02-13 | Payer: 59

## 2017-02-13 DIAGNOSIS — Z800 Family history of malignant neoplasm of digestive organs: ICD-10-CM

## 2017-02-13 DIAGNOSIS — E876 Hypokalemia: ICD-10-CM

## 2017-02-13 DIAGNOSIS — E878 Other disorders of electrolyte and fluid balance, not elsewhere classified: Principal | ICD-10-CM

## 2017-02-13 DIAGNOSIS — Z803 Family history of malignant neoplasm of breast: ICD-10-CM

## 2017-02-13 DIAGNOSIS — M797 Fibromyalgia: ICD-10-CM

## 2017-02-13 DIAGNOSIS — Z87891 Personal history of nicotine dependence: ICD-10-CM

## 2017-02-13 DIAGNOSIS — Z048 Encounter for examination and observation for other specified reasons: ICD-10-CM

## 2017-02-13 DIAGNOSIS — Z9013 Acquired absence of bilateral breasts and nipples: ICD-10-CM

## 2017-02-13 DIAGNOSIS — C50912 Malignant neoplasm of unspecified site of left female breast: ICD-10-CM

## 2017-02-13 LAB — COMPREHENSIVE METABOLIC PANEL
Lab: 0.2 mg/dL — ABNORMAL LOW (ref 0.3–1.2)
Lab: 0.7 mg/dL (ref 0.4–1.00)
Lab: 10 U/L (ref 7–56)
Lab: 10 mg/dL — ABNORMAL HIGH (ref 7–25)
Lab: 144 MMOL/L — ABNORMAL LOW (ref 137–147)
Lab: 19 U/L (ref 7–40)
Lab: 28 MMOL/L (ref 21–30)
Lab: 3.4 g/dL — ABNORMAL LOW (ref 3.5–5.0)
Lab: 5.4 g/dL — ABNORMAL LOW (ref 6.0–8.0)
Lab: 53 U/L (ref 25–110)
Lab: 6 K/UL (ref 3–12)
Lab: 7.6 mg/dL — ABNORMAL LOW (ref 8.5–10.6)
Lab: >60 mL/min (ref >60)
Lab: >60 mL/min (ref >60)

## 2017-02-13 LAB — CBC AND DIFF
Lab: 0 10*3/uL (ref 0–0.20)
Lab: 0 10*3/uL (ref 0–0.45)
Lab: 5.5 10*3/uL (ref 4.5–11.0)

## 2017-02-13 LAB — BASIC METABOLIC PANEL
Lab: 143 MMOL/L (ref 137–147)
Lab: 28 MMOL/L (ref >60)
Lab: 4 K/UL (ref 3–12)
Lab: 4 MMOL/L (ref 3.5–5.1)
Lab: 8 mg/dL (ref 7–25)
Lab: 94 mg/dL (ref 70–100)

## 2017-02-13 LAB — IONIZED CALCIUM,BG: Lab: 0.9 MMOL/L — ABNORMAL LOW (ref 1.0–1.3)

## 2017-02-13 LAB — MAGNESIUM
Lab: 1.1 mg/dL — ABNORMAL LOW (ref 1.6–2.6)
Lab: 1.5 mg/dL — ABNORMAL LOW (ref >60)

## 2017-02-13 LAB — VITAMIN B12: Lab: 437 pg/mL (ref 180–914)

## 2017-02-13 LAB — FOLATE, SERUM: Lab: 5.8 ng/mL — ABNORMAL LOW (ref >3.9)

## 2017-02-13 LAB — IRON + BINDING CAPACITY + %SAT+ FERRITIN
Lab: 60 ug/dL (ref 50–160)
Lab: 88 ng/mL (ref 10–200)

## 2017-02-13 LAB — PHOSPHORUS: Lab: 3.3 mg/dL — ABNORMAL LOW (ref 2.0–4.0)

## 2017-02-13 MED ORDER — DIAZEPAM 5 MG PO TAB
5 mg | ORAL | 0 refills | Status: DC | PRN
Start: 2017-02-13 — End: 2017-02-13
  Administered 2017-02-13: 11:00:00 5 mg via ORAL

## 2017-02-13 MED ORDER — CALCIUM GLUCONATE 100 MG/ML (10%) IV SOLN
1 g | Freq: Once | INTRAVENOUS | 0 refills | Status: DC
Start: 2017-02-13 — End: 2017-02-13

## 2017-02-13 MED ORDER — DEXTROAMPHETAMINE-AMPHETAMINE 10 MG PO TAB
30 mg | Freq: Two times a day (BID) | ORAL | 0 refills | Status: DC
Start: 2017-02-13 — End: 2017-02-13

## 2017-02-13 MED ORDER — POTASSIUM CHLORIDE 20 MEQ/15 ML PO LIQD
40 meq | Freq: Once | ORAL | 0 refills | Status: CP
Start: 2017-02-13 — End: ?
  Administered 2017-02-13: 08:00:00 40 meq via ORAL

## 2017-02-13 MED ORDER — CALCIUM GLUCONATE 1GM/100ML NS MB+
1 g | Freq: Once | INTRAVENOUS | 0 refills | Status: DC
Start: 2017-02-13 — End: 2017-02-13

## 2017-02-13 MED ORDER — CALCIUM GLUCONATE IVPB (NON-STD)
2 g | Freq: Once | INTRAVENOUS | 0 refills | Status: CP
Start: 2017-02-13 — End: ?
  Administered 2017-02-13 (×2): 2 g via INTRAVENOUS

## 2017-02-13 MED ORDER — SENNOSIDES-DOCUSATE SODIUM 8.6-50 MG PO TAB
1 | Freq: Two times a day (BID) | ORAL | 0 refills | Status: DC
Start: 2017-02-13 — End: 2017-02-13
  Administered 2017-02-13: 13:00:00 1 via ORAL

## 2017-02-13 MED ORDER — POTASSIUM CHLORIDE 20 MEQ/15 ML PO LIQD
40 meq | Freq: Once | ORAL | 0 refills | Status: CP
Start: 2017-02-13 — End: ?
  Administered 2017-02-13: 07:00:00 40 meq via ORAL

## 2017-02-13 MED ORDER — PROCHLORPERAZINE MALEATE 10 MG PO TAB
10 mg | ORAL | 0 refills | Status: DC | PRN
Start: 2017-02-13 — End: 2017-02-13

## 2017-02-13 MED ORDER — LORAZEPAM 1 MG PO TAB
1 mg | Freq: Every evening | ORAL | 0 refills | Status: DC | PRN
Start: 2017-02-13 — End: 2017-02-13

## 2017-02-13 MED ORDER — DOCUSATE SODIUM 100 MG PO CAP
100 mg | Freq: Two times a day (BID) | ORAL | 0 refills | Status: DC
Start: 2017-02-13 — End: 2017-02-13
  Administered 2017-02-13: 13:00:00 100 mg via ORAL

## 2017-02-13 MED ORDER — FOLIC ACID 1 MG PO TAB
1 mg | ORAL_TABLET | Freq: Every day | ORAL | 0 refills | Status: AC
Start: 2017-02-13 — End: 2017-02-15

## 2017-02-13 MED ORDER — MAGNESIUM SULFATE IN D5W 1 GRAM/100 ML IV PGBK
1 g | INTRAVENOUS | 0 refills | Status: CP
Start: 2017-02-13 — End: ?
  Administered 2017-02-13 (×2): 1 g via INTRAVENOUS

## 2017-02-13 MED ORDER — POTASSIUM CHLORIDE 20 MEQ/15 ML PO LIQD
20 meq | Freq: Once | ORAL | 0 refills | Status: CP
Start: 2017-02-13 — End: ?
  Administered 2017-02-13: 13:00:00 20 meq via ORAL

## 2017-02-13 MED ORDER — HEPARIN, PORCINE (PF) 100 UNIT/ML IV SYRG
500 [IU] | Freq: Once | INTRAVENOUS | 0 refills | Status: CP
Start: 2017-02-13 — End: ?
  Administered 2017-02-13: 20:00:00 500 [IU] via INTRAVENOUS

## 2017-02-13 MED ORDER — ALPRAZOLAM 0.5 MG PO TAB
.5 mg | Freq: Once | ORAL | 0 refills | Status: CP
Start: 2017-02-13 — End: ?
  Administered 2017-02-13: 18:00:00 0.5 mg via ORAL

## 2017-02-13 MED ORDER — MAGNESIUM SULFATE IN D5W 1 GRAM/100 ML IV PGBK
1 g | INTRAVENOUS | 0 refills | Status: CP
Start: 2017-02-13 — End: ?
  Administered 2017-02-13 (×2): 1 g via INTRAVENOUS

## 2017-02-13 MED ORDER — OXYCODONE-ACETAMINOPHEN 5-325 MG PO TAB
1-2 | ORAL | 0 refills | Status: DC | PRN
Start: 2017-02-13 — End: 2017-02-13

## 2017-02-13 MED ORDER — ENOXAPARIN 40 MG/0.4 ML SC SYRG
40 mg | Freq: Every day | SUBCUTANEOUS | 0 refills | Status: DC
Start: 2017-02-13 — End: 2017-02-13
  Administered 2017-02-13: 13:00:00 40 mg via SUBCUTANEOUS

## 2017-02-13 NOTE — ED Notes
Patient resting quietly, states, she is not feeling better after magnesium and fluids. Dr Verdis Frederickson aware.

## 2017-02-13 NOTE — H&P (View-Only)
Admission History and Physical Examination      Name:  Molly Randolph                                             MRN:  [id number]   Admission Date:  02/12/2017                     Assessment/Plan:    Active Problems:    Electrolyte abnormality    Molly Randolph is a 46 y.o. 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     Hypocalcemia  Hypomagnesemia  - presented with muscle cramping, perioral tingling  - likely d/t poor po intake/ current chemotherapy   - given 2 g Ca gluconate, 2g magnesium and of potassium,  500cc LR in ED  > recheck labs later this morning    Macrocytic anemia   - Hb trending down while on chemotherapy, denies any bleeding, dark stools etc  - Most likely chemotherapy induced anemia  > Fe panel, B12, folate    Breast cancer   - 2016 diagnosed with right breast cancer, treated with Tamoxifen  - 08/2016 diagnosed with new left breast IDC, grade III, ER 0+, PR 0+, HER2 3+   - s/p left mastectomy, SLN +  - on cycle 6 of taxotere, carboplatin, herceptin. Last treatment was 7/27, due for next one this coming Friday  - follows with Dr. Welton Flakes      FEN: no IVF, replace prn, regular diet   ppx lovenox   Code: full     Dispo: admit to Onc service under observation     Discussed with oncology fellow.    __________________________________________________________________________________  Primary Care Physician: [place], [place]  Verified    Chief Complaint: Muscle cramps, perioral tingling   History of Present Illness: Molly Randolph is a 46 y.o. 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   Symptoms began about 3 days ago, with headache and leg cramps. Today she started having perioral tingling which prompted her to come to the ED. She has had similar symptoms in the past with chemo, but the tingling sensation has not been quite as pronounced before. Her last treatment was on 7/27 and she is due for her next this coming Friday. For the last week, she has not been eating or drinking like she normally does due to lack of appetite and stress. She has been nauseous but this is not worse than it usually is. She denies vomiting, diarrhea, fevers, vision changes, or lightheadedness.   In the ED, her labs were notable for hypocalcemia, hypomagnesemia, and mild hypokalemia. She was given 2 g Ca gluconate, 2g magnesium and of potassium, as well as 500cc LR. She only felt slightly improved after these interventions and did not feel ready to return home.     Past Medical History:   Diagnosis Date   ???  Arthritis     Lower back.   ??? Breast cancer (HCC) 07/05/2014    right ILC   ??? Breast cancer (HCC) 2018    Left Breast    ??? Depression    ??? Fibromyalgia    ??? Limb alert care status     Right Arm   ??? PTSD (post-traumatic stress disorder)      Past Surgical History:   Procedure Laterality Date   ??? BREAST BIOPSY Right 07/05/2014   ??? PR MASTECTOMY SIMPLE COMPLETE Right 08/04/2014    MASTECTOMY WITH SENTINEL NODE BIOPSY [facility], [facility]   ??? PR BRST RCNSTJ IMMT/DLYD W/TISS EXPANDER SBSQ XPNSJ Right 08/04/2014    INSERTION TISSUE EXPANDER BREAST Marlinda Mike, MD [facility]   ??? HX MASTECTOMY Right 08/04/2014   ??? PR MAMMAPLASTY AUGMENTATION W/PROSTHETIC IMPLANT Right 09/16/2014    RECONSTRUCTION BREAST WITH IMPLANT [facility], MD [facility]   ??? PR REMOVAL TISS EXPANDER W/O INSERTION PROSTHESIS Right 09/16/2014    REMOVAL TISSUE EXPANDER [facility]inda [facility], [facility] ??? HX BREAST RECONSTRUCTION Right 09/16/2014   ??? PR REDUCTION MAMMAPLASTY Bilateral 10/14/2015    MASTOPEXY LEFT BREAST FOR SYMMETRY, FAT GRAFTING TO LEFT, RIGHT BREAST [facility]inda Mike, MD [facility]   ??? MASTECTOMY Left 10/08/2016    Left Skin Sparing Mastectomy, Intra-Operative Lymphatic Mapping, Sentinel Lymph Node Biopsy performed by Cordelia Poche, [facility] at [facility] OR[facility]PERI[facility]   ??? BREAST SURGERY Left 10/08/2016    INSERTION TISSUE EXPANDER BREAST WITH ALLODERM performed by Marlinda Mike, MD at [facility] OR/PERIOP   ??? PORTACATH PLACEMENT Right 10/08/2016    INSERTION VENOUS ACCESS PORT performed by Janine Ores, DO at [facility] OR/PERIOP   ??? ENDOMETRIAL ABLATION     ??? HX SINUS SURGERY     ??? LYMPH NODE BIOPSY      left neck   ??? MOUTH SURGERY     ??? TONSILLECTOMY     ??? TUBAL LIGATION       Family History   Problem Relation Age of Onset   ??? Cancer-Breast Mother 84   ??? Cancer-Colon Mother 9   ??? Cancer-Breast Maternal Aunt 40   ??? Cancer Maternal Grandmother         uterine     Social History     Social History   ??? Marital status: Single     Spouse name: N/A   ??? Number of children: N/A   ??? Years of education: N/A     Social History Main Topics   ??? Smoking status: Former Smoker     Packs/day: 1.00     Years: 29.00     Types: Cigarettes     Quit date: 07/01/2014   ??? Smokeless tobacco: Never Used      Comment: Quit 2 wks ago    ??? Alcohol use No   ??? Drug use: Yes     Types: Marijuana      Comment: 3x per month   ??? Sexual activity: Not on file     Other Topics Concern   ??? Not on file     Social History Narrative   ??? No narrative on file      Immunizations (includes history and patient reported):   There is no immunization history on file for this patient.        Allergies:  Patient has no known allergies.    Medications:  Current Facility-Administered Medications   Medication   ???  calcium gluconate 2 g in sodium chloride 0.9% (NS) 120 mL IVPB     Current Outpatient Prescriptions   Medication Sig ??? ALPRAZolam (XANAX) 1 mg tablet Take 1 mg by mouth three times daily as needed for Anxiety.   ??? desvenlafaxine(+) (PRISTIQ) 50 mg tablet Take 50 mg by mouth daily.   ??? dexamethasone (DECADRON) 4 mg tablet Take 2 tablets by mouth twice daily. Take the day before, the day of, and the day after chemotherapy.   ??? dextroamphetamine-amphetamine(+) (ADDERALL) 30 mg tablet Take 30 mg by mouth twice daily   ??? diazePAM (VALIUM) 5 mg tablet Take 1 tablet by mouth every 6 hours as needed (muscle spasm).   ??? docusate (COLACE) 100 mg capsule Take 1 capsule by mouth twice daily.   ??? ibuprofen (MOTRIN) 600 mg tablet Take 1 tablet by mouth every 6 hours as needed for Pain. Take with food.   ??? lidocaine/prilocaine (EMLA) 2.5/2.5 % topical cream Apply nickel size amount to port access site. Cover with plastic for 30-45 minutes. Wipe clean prior to accessing port   ??? LORazepam (ATIVAN) 1 mg tablet Take 1 tablet by mouth at bedtime as needed for Nausea, Vomiting or Other....   ??? oxyCODONE/acetaminophen (PERCOCET; ENDOCET; ROXICET) 5/325 mg tablet Take 1-2 tablets by mouth every 4 hours as needed for Pain   ??? prochlorperazine maleate (COMPAZINE) 10 mg tablet Take 1 tablet by mouth every 6 hours as needed.   ??? senna/docusate (SENOKOT-S) 8.6/50 mg tablet Take 1 tablet by mouth twice daily. To prevent constipation while taking pain medication.     Facility-Administered Medications Ordered in Other Encounters   Medication   ??? cephalexin (KEFLEX) capsule 500 mg     Review of Systems:  A 14 point review of systems was negative except for noted in HPI.     Physical Exam:  Vital Signs: Last Filed In 24 Hours Vital Signs: 24 Hour Range   BP: 139/77 (08/15 0200)  Temp: 37 ???C (98.6 ???F) (08/14 2012)  Pulse: 89 (08/15 0200)  Respirations: 16 PER MINUTE (08/15 0200)  SpO2: 95 % (08/15 0200)  O2 Delivery: None (Room Air) (08/14 2012)  SpO2 Pulse: 87 (08/15 0200) BP: (128-151)/(69-89)   Temp:  [37 ???C (98.6 ???F)]   Pulse:  [82-97] Respirations:  [16 PER MINUTE-27 PER MINUTE]   SpO2:  [94 %-100 %]   O2 Delivery: None (Room Air)          General:  Alert, cooperative, no distress.   Head:  Alopecia. Normocephalic, without obvious abnormality, atraumatic  Eyes:  Conjunctivae/corneas clear.  PERRL, EOMs intact.    Throat: Lips, mucosa and tongue normal.  Teeth and gums normal  Neck:    Supple, symmetrical, trachea midline, no JVD  Lungs:  Clear to auscultation bilaterally  Heart:   Regular rate and rhythm, S1, S2 normal, no murmur, click rub or gallop  Abdomen:  Soft, mild diffuse tenderness to palpation.  Bowel sounds normal.  No masses.  No organomegaly.  Extremities: Extremities normal, atraumatic, no cyanosis or edema  Skin: Skin color, texture, turgor normal.  No rashes or lesions  Neurologic:   CNII - XII intact.  Normal strength.    Lab/Radiology/Other Diagnostic Tests:  24-hour labs:    Results for orders placed or performed during the hospital encounter of 02/12/17 (from the past 24 hour(s))   CBC AND DIFF    Collection Time: 02/12/17 10:54 PM   Result Value Ref Range    White Blood Cells  5.5 4.5 - 11.0 K/UL    RBC 2.49 (L) 4.0 - 5.0 M/UL    Hemoglobin 8.5 (L) 12.0 - 15.0 GM/DL    Hematocrit 40.9 (L) 36 - 45 %    MCV 100.3 (H) 80 - 100 FL    MCH 34.2 (H) 26 - 34 PG    MCHC 34.1 32.0 - 36.0 G/DL    RDW 81.1 (H) 11 - 15 %    Platelet Count 261 150 - 400 K/UL    MPV 7.1 7 - 11 FL    Neutrophils 51 41 - 77 %    Lymphocytes 37 24 - 44 %    Monocytes 11 4 - 12 %    Eosinophils 0 0 - 5 %    Basophils 1 0 - 2 %    Absolute Neutrophil Count 2.80 1.8 - 7.0 K/UL    Absolute Lymph Count 2.00 1.0 - 4.8 K/UL    Absolute Monocyte Count 0.60 0 - 0.80 K/UL    Absolute Eosinophil Count 0.00 0 - 0.45 K/UL    Absolute Basophil Count 0.00 0 - 0.20 K/UL   COMPREHENSIVE METABOLIC PANEL    Collection Time: 02/12/17 10:54 PM   Result Value Ref Range    Sodium 144 137 - 147 MMOL/L    Potassium 3.4 (L) 3.5 - 5.1 MMOL/L    Chloride 110 98 - 110 MMOL/L Glucose 95 70 - 100 MG/DL    Blood Urea Nitrogen 10 7 - 25 MG/DL    Creatinine 9.14 0.4 - 1.00 MG/DL    Calcium 7.6 (L) 8.5 - 10.6 MG/DL    Total Protein 5.4 (L) 6.0 - 8.0 G/DL    Total Bilirubin 0.2 (L) 0.3 - 1.2 MG/DL    Albumin 3.4 (L) 3.5 - 5.0 G/DL    Alk Phosphatase 53 25 - 110 U/L    AST (SGOT) 19 7 - 40 U/L    CO2 28 21 - 30 MMOL/L    ALT (SGPT) 10 7 - 56 U/L    Anion Gap 6 3 - 12    eGFR Non African American >60 >60 mL/min    eGFR African American >60 >60 mL/min   MAGNESIUM    Collection Time: 02/12/17 10:54 PM   Result Value Ref Range    Magnesium 1.1 (L) 1.6 - 2.6 mg/dL   PHOSPHORUS    Collection Time: 02/12/17 10:54 PM   Result Value Ref Range    Phosphorus 3.3 2.0 - 4.0 MG/DL   IONIZED CALCIUM,BG    Collection Time: 02/12/17 10:54 PM   Result Value Ref Range    Ionized Calcium 0.90 (L) 1.0 - 1.3 MMOL/L     Glucose: 95 (02/12/17 2254)  Pertinent radiology reviewed.    Inez Catalina, MD  Pager [phone]

## 2017-02-13 NOTE — ED Notes
All belongings gathered and placed in belonging bag with patient labels at bedside. The bag(s) contain(s) the following:    Clothing: tshirt and shorts Shoes: Sandals  Jewelry: nose ring  Identification/Drivers license: Designer, multimedia, wallet with ID and drivers license.   Cash: $15.00  Credit cards: Credit cards x2.   Electronics: Air cabin crew aids: none  All belongings placed in white  Bag with patient label.     Belongings disposition: Transferred to room with patient at bedside.

## 2017-02-13 NOTE — Discharge Instructions - Pharmacy
Physician Discharge Summary      Name: Molly Randolph  Medical Record Number: [id number]        Account Number:  [id number]  Date Of Birth:  12-11-1970                         Age:  45 years   Admit date:  02/12/2017                     Discharge date:  02/13/2017    Attending Physician:  Carron Brazen, MD             Service: Oncology    Physician Summary completed by: Edd Fabian, DO    Reason for hospitalization: electrolyte abnormalities    Significant PMH:   Past Medical History:   Diagnosis Date   ??? Arthritis     Lower back.   ??? Breast cancer (HCC) 07/05/2014    right ILC   ??? Breast cancer (HCC) 2018    Left Breast    ??? Depression    ??? Fibromyalgia    ??? Limb alert care status     Right Arm   ??? PTSD (post-traumatic stress disorder)           Allergies: Patient has no known allergies.    Admission Physical Exam notable for:       General:  Alert, cooperative, no distress.   Head:  Alopecia. Normocephalic, without obvious abnormality, atraumatic  Eyes:  Conjunctivae/corneas clear.  PERRL, EOMs intact.    Throat: Lips, mucosa and tongue normal.  Teeth and gums normal  Neck:    Supple, symmetrical, trachea midline, no JVD  Lungs:  Clear to auscultation bilaterally  Heart:   Regular rate and rhythm, S1, S2 normal, no murmur, click rub or gallop  Abdomen:  Soft, mild diffuse tenderness to palpation.  Bowel sounds normal.  No masses.  No organomegaly.  Extremities: Extremities normal, atraumatic, no cyanosis or edema      Admission Lab/Radiology studies notable for:   Calcium: 7.6 (corrected 8.0), ionized calcium: 0.90  K: 3.2  Mag: 1.1    Folate: 5.8  B12: 437    Hemoglobin: 8.5  MCV: 100.3    Brief Hospital Course:  The patient was admitted and the following issues were addressed during this hospitalization: (with pertinent details).      ???  Molly Randolph is a 46 y.o. 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 Patient was found to have hypocalcemia, hypokalemia, and hypomagnesemia. She was given 2g Ca gluconate, 4g magnesium, and 80 mEq potassium and electrolytes improved. Symptoms also improved and patient did not have any more perioral numbness or muscle cramps. The electrolyte abnormalities are side effects of her chemotherapy. Her decreased PO intake also contributing to these abnormalities. Folic acid was low normal so patient sent home with a prescription of folic acid.     Condition at Discharge: Stable    Discharge Diagnoses:       Hospital Problems        Active Problems    Electrolyte abnormality          Surgical Procedures: None    Significant Diagnostic Studies and Procedures: none  Consults:  None    Patient Disposition: Home       Patient instructions/medications:     Activity as Tolerated   It is important to keep increasing your activity level after you leave the hospital.  Moving around can help prevent blood clots, lung infection (pneumonia) and other problems.  Gradually increasing the number of times you are up moving around will help you return to your normal activity level more quickly.  Continue to increase the number of times you are up to the chair and walking daily to return to your normal activity level. Begin to work toward your normal activity level at discharge     Report These Signs and Symptoms   Please contact your doctor if you have any of the following symptoms: temperature higher than 100 degrees F, uncontrolled pain, persistent nausea and/or vomiting or difficulty breathing     Questions About Your Stay   For questions or concerns regarding your hospital stay. Call [phone]   Discharging attending physician: Carron Brazen [[id number]]      Regular Diet   You have no dietary restriction. Please continue with a healthy balanced diet.        Current Discharge Medication List       START taking these medications    Details   folic acid (FOLVITE) 1 mg tablet Take one tablet by mouth daily.  Qty: 90 tablet, Refills: 0    PRESCRIPTION TYPE:  Normal          CONTINUE these medications which have NOT CHANGED    Details   ALPRAZolam (XANAX) 1 mg tablet Take 1 mg by mouth three times daily as needed for Anxiety.    PRESCRIPTION TYPE:  Historical Med      calcium carbonate (TUMS) 500 mg (200 mg elemental calcium) chewable tablet Chew 500 mg by mouth twice daily with meals.    PRESCRIPTION TYPE:  Historical Med      dexamethasone (DECADRON) 4 mg tablet Take 2 tablets by mouth twice daily. Take the day before, the day of, and the day after chemotherapy.  Qty: 16 tablet, Refills: 0    PRESCRIPTION TYPE:  Normal      docusate (COLACE) 100 mg capsule Take 1 capsule by mouth twice daily.  Qty: 60 capsule, Refills: 0    PRESCRIPTION TYPE:  Normal      ibuprofen (ADVIL) 200 mg tablet Take 800 mg by mouth twice daily. Take with food.    PRESCRIPTION TYPE:  Historical Med      lidocaine/prilocaine (EMLA) 2.5/2.5 % topical cream Apply nickel size amount to port access site. Cover with plastic for 30-45 minutes. Wipe clean prior to accessing port  Qty: 30 g, Refills: 1    PRESCRIPTION TYPE:  Normal      LORazepam (ATIVAN) 1 mg tablet Take 1 tablet by mouth at bedtime as needed for Nausea, Vomiting or Other....  Qty: 30 tablet, Refills: 2    PRESCRIPTION TYPE:  Print      prochlorperazine maleate (COMPAZINE) 10 mg tablet Take 1 tablet by mouth every 6 hours as needed.  Qty: 30 tablet, Refills: 0    PRESCRIPTION TYPE:  Normal          The following medications were removed from your list. This list includes medications discontinued this stay and those removed from your prior med list in our system        desvenlafaxine(+) (PRISTIQ) 50 mg tablet        dextroamphetamine-amphetamine(+) (  ADDERALL) 30 mg tablet        diazePAM (VALIUM) 5 mg tablet oxyCODONE/acetaminophen (PERCOCET; ENDOCET; ROXICET) 5/325 mg tablet        senna/docusate (SENOKOT-S) 8.6/50 mg tablet               Scheduled appointments:    Feb 15, 2017  8:00 AM CDT  (Arrive by 7:45 AM)  Lab with CC NURSE CHAIR  [facility] [employer] Exam [facility] Exam) [facility]  [address] [place] [address]  [phone]   Feb 15, 2017  8:45 AM CDT  (Arrive by 8:30 AM)  Return Patient with Gershon Mussel, APRN  [facility] Exam [facility] Exam) [facility]  [address] [place] [address]  [phone]   Feb 15, 2017 10:00 AM CDT  (Arrive by 9:45 AM)  Treatment 04 Hour with TREATMENT ROOM 31  [facility] [employer] Treatment [facility]) [facility] [place] [phone] [facility] [address] Grandchild  [place] [address]  [phone]   Feb 26, 2017  1:40 PM CDT  (Arrive by 1:25 PM)  Lymphedema Prev Initial Visit with Alfredia Client Ward, RN  [facility] Exam [facility] Exam) [facility]  [address] [place] [address]  [phone]   Feb 26, 2017  3:00 PM CDT  (Arrive by 2:45 PM)  Return Patient with Carylon Perches, APRN  [facility] - [facility] Exam [facility] Exam) [facility]  [address] [place] [address]  [phone]              Signed:  Edd Fabian, DO  02/13/2017      cc:  Primary Care Physician:  [place], [place]   Verified  Referring physicians:  No ref. provider found   Additional provider(s):

## 2017-02-13 NOTE — Progress Notes
General Progress Note    Name:  Molly Randolph   JYNWG'N Date:  02/13/2017  Admission Date: 02/12/2017  LOS: 0 days                     Assessment/Plan:    Active Problems:    Electrolyte abnormality    Molly Randolph is a 46 y.o. 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   ???  Hypocalcemia  Hypomagnesemia  - presented with muscle cramping, perioral tingling  - likely d/t poor po intake/ current chemotherapy   - given 2 g Ca gluconate, 2g magnesium and of potassium,  500cc LR in ED  > recheck labs show improvement in calcium level and magnesium of 1.5.    ???  Macrocytic anemia   - Hb trending down while on chemotherapy, denies any bleeding, dark stools etc  - Most likely chemotherapy induced anemia  > Fe panel, B12, folate. Patient has low folate, will send home on folic acid.   ???  Breast cancer   - 2016 diagnosed with right breast cancer, treated with Tamoxifen  - 08/2016 diagnosed with new left breast IDC, grade III, ER 0+, PR 0+, HER2 3+   - s/p left mastectomy, SLN +  - on cycle 6 of taxotere, carboplatin, herceptin. Last treatment was 7/27, due for next one this coming Friday  - follows with Dr. Welton Flakes  ???  ???  FEN: no IVF, replace prn, regular diet   ppx lovenox   Code: full   ???  Dispo: discharge after last dose of magnesium.   ________________________________________________________________________    Subjective  Molly Randolph is a 46 y.o. female.  Patient doing better this AM. Perioral numbness resolved. Okay to go home. No complaints.     Medications  Scheduled Meds:  dextroamphetamine-amphetamine (ADDERALL) tablet 30 mg 30 mg Oral BID   docusate (COLACE) capsule 100 mg 100 mg Oral BID   enoxaparin (LOVENOX) syringe 40 mg 40 mg Subcutaneous QDAY(21) magnesium sulfate   1 g/D5W 100 mL IVPB 1 g Intravenous Q1H X 2DO   senna/docusate (SENOKOT-S) tablet 1 tablet 1 tablet Oral BID   Continuous Infusions:  PRN and Respiratory Meds:diazePAM Q6H PRN, LORazepam QHS PRN, oxyCODONE/acetaminophen Q4H PRN, prochlorperazine maleate Q6H PRN      Review of Systems:  All other systems reviewed and are negative.    Objective:                          Vital Signs: Last Filed                 Vital Signs: 24 Hour Range   BP: 188/59 (08/15 1047)  Temp: 36.7 ???C (98.1 ???F) (08/15 1047)  Pulse: 86 (08/15 1047)  Respirations: 17 PER MINUTE (08/15 1047)  SpO2: 95 % (08/15 1047)  O2 Delivery: None (Room Air) (08/15 1047)  SpO2 Pulse: 84 (08/15 0500)  Height: 165.1 cm (65) (08/15 0614) BP: (128-188)/(57-98)   Temp:  [36.7 ???C (98.1 ???F)-37 ???C (98.6 ???F)]   Pulse:  [82-97]   Respirations:  [15 PER MINUTE-27 PER MINUTE]   SpO2:  [94 %-100 %]   O2 Delivery: None (Room Air)  Intensity Pain Scale 0-10 (Pain 1): Asleep (02/13/17 1139) Vitals:    02/12/17 2012   Weight: 72.6 kg (160 lb)       Intake/Output Summary:  (Last 24 hours)    Intake/Output Summary (Last 24 hours) at 02/13/17 1319  Last data filed at 02/13/17 1049   Gross per 24 hour   Intake              720 ml   Output              400 ml   Net              320 ml      Stool Occurrence: 0    Physical Exam  General:  Alert, cooperative, no distress, appears stated age  Head:  Normocephalic, without obvious abnormality, atraumatic  Eyes:  Conjunctivae/corneas clear.  PERRL, EOMs intact.  Fundi benign  Ears: Normal TMs and external ear canals, both ears  Nose: Nares normal.  Septum midline. Mucosa normal.  No drainageor sinus tenderness  Back:  Symmetric, no curvature, ROM normal.  No CVA tenderness.  Lungs:  Clear to auscultation bilaterally  Chest wall:  No tenderness or deformity.  Heart:   Regular rate and rhythm, S1, S2 normal, no murmur, click rub or gallop  Abdomen:  Soft, non-tender.  Bowel sounds normal.  No masses.  No organomegaly.  Extremities: Extremities normal, atraumatic, no cyanosis or edema  Neurologic:   CNII - XII intact.  Normal strength, sensation and reflexes throughout.    Lab Review  24-hour labs:    Results for orders placed or performed during the hospital encounter of 02/12/17 (from the past 24 hour(s))   CBC AND DIFF    Collection Time: 02/12/17 10:54 PM   Result Value Ref Range    White Blood Cells 5.5 4.5 - 11.0 K/UL    RBC 2.49 (L) 4.0 - 5.0 M/UL    Hemoglobin 8.5 (L) 12.0 - 15.0 GM/DL    Hematocrit 16.1 (L) 36 - 45 %    MCV 100.3 (H) 80 - 100 FL    MCH 34.2 (H) 26 - 34 PG    MCHC 34.1 32.0 - 36.0 G/DL    RDW 09.6 (H) 11 - 15 %    Platelet Count 261 150 - 400 K/UL    MPV 7.1 7 - 11 FL    Neutrophils 51 41 - 77 %    Lymphocytes 37 24 - 44 %    Monocytes 11 4 - 12 %    Eosinophils 0 0 - 5 %    Basophils 1 0 - 2 %    Absolute Neutrophil Count 2.80 1.8 - 7.0 K/UL    Absolute Lymph Count 2.00 1.0 - 4.8 K/UL    Absolute Monocyte Count 0.60 0 - 0.80 K/UL    Absolute Eosinophil Count 0.00 0 - 0.45 K/UL    Absolute Basophil Count 0.00 0 - 0.20 K/UL   COMPREHENSIVE METABOLIC PANEL    Collection Time: 02/12/17 10:54 PM   Result Value Ref Range    Sodium 144 137 - 147 MMOL/L    Potassium 3.4 (L) 3.5 - 5.1 MMOL/L    Chloride 110 98 - 110 MMOL/L    Glucose 95 70 - 100 MG/DL    Blood Urea Nitrogen 10 7 - 25 MG/DL    Creatinine 0.45 0.4 - 1.00 MG/DL    Calcium 7.6 (L) 8.5 - 10.6 MG/DL    Total Protein 5.4 (  L) 6.0 - 8.0 G/DL    Total Bilirubin 0.2 (L) 0.3 - 1.2 MG/DL    Albumin 3.4 (L) 3.5 - 5.0 G/DL    Alk Phosphatase 53 25 - 110 U/L    AST (SGOT) 19 7 - 40 U/L    CO2 28 21 - 30 MMOL/L    ALT (SGPT) 10 7 - 56 U/L    Anion Gap 6 3 - 12    eGFR Non African American >60 >60 mL/min    eGFR African American >60 >60 mL/min   MAGNESIUM    Collection Time: 02/12/17 10:54 PM   Result Value Ref Range    Magnesium 1.1 (L) 1.6 - 2.6 mg/dL   PHOSPHORUS    Collection Time: 02/12/17 10:54 PM   Result Value Ref Range Phosphorus 3.3 2.0 - 4.0 MG/DL   IONIZED CALCIUM,BG    Collection Time: 02/12/17 10:54 PM   Result Value Ref Range    Ionized Calcium 0.90 (L) 1.0 - 1.3 MMOL/L   IRON + BINDING CAPACITY + %SAT+ FERRITIN    Collection Time: 02/13/17  9:00 AM   Result Value Ref Range    Iron 60 50 - 160 MCG/DL    Iron Binding-TIBC 098 270 - 380 MCG/DL    % Saturation 20 (L) 28 - 42 %    Ferritin 88 10 - 200 NG/ML   VITAMIN B12    Collection Time: 02/13/17  9:00 AM   Result Value Ref Range    Vitamin B12 437 180 - 914 PG/ML   FOLATE, SERUM    Collection Time: 02/13/17  9:00 AM   Result Value Ref Range    Serum Folate 5.8 >3.9 NG/ML   BASIC METABOLIC PANEL    Collection Time: 02/13/17  9:00 AM   Result Value Ref Range    Sodium 143 137 - 147 MMOL/L    Potassium 4.0 3.5 - 5.1 MMOL/L    Chloride 111 (H) 98 - 110 MMOL/L    CO2 28 21 - 30 MMOL/L    Anion Gap 4 3 - 12    Glucose 94 70 - 100 MG/DL    Blood Urea Nitrogen 8 7 - 25 MG/DL    Creatinine 1.19 0.4 - 1.00 MG/DL    Calcium 8.3 (L) 8.5 - 10.6 MG/DL    eGFR Non African American >60 >60 mL/min    eGFR African American >60 >60 mL/min   MAGNESIUM    Collection Time: 02/13/17  9:00 AM   Result Value Ref Range    Magnesium 1.5 (L) 1.6 - 2.6 mg/dL   , Hematology:    Lab Results   Component Value Date    HGB 8.5 02/12/2017    HCT 25.0 02/12/2017    PLTCT 261 02/12/2017    WBC 5.5 02/12/2017    NEUT 51 02/12/2017    ANC 2.80 02/12/2017    ALC 2.00 02/12/2017    MONA 11 02/12/2017    AMC 0.60 02/12/2017    ABC 0.00 02/12/2017    MCV 100.3 02/12/2017    MCHC 34.1 02/12/2017    MPV 7.1 02/12/2017    RDW 15.2 02/12/2017   , Coagulation:  No results found for: PT, PTT, INR, General Chemistry:    Lab Results   Component Value Date    NA 143 02/13/2017    K 4.0 02/13/2017    CL 111 02/13/2017    GAP 4 02/13/2017    BUN 8 02/13/2017    CR 0.54  02/13/2017    GLU 94 02/13/2017    CA 8.3 02/13/2017    ALBUMIN 3.4 02/12/2017    OBSCA 0.90 02/12/2017    MG 1.5 02/13/2017    TOTBILI 0.2 02/12/2017 and Pertinent labs reviewed    Point of Care Testing  (Last 24 hours)  Glucose: 94 (02/13/17 0900)    Radiology and other Diagnostics Review:    Pertinent radiology reviewed.    Edd Fabian, DO   Pager [phone]

## 2017-02-13 NOTE — ED Notes
Numbness in face, and both hands, off and on today. No focal deficits, grasps equal bilat. Last chemo 2 wks ago.

## 2017-02-13 NOTE — ED Notes
[id number] @ 0405. Please call Dawn, RN at [phone].

## 2017-02-13 NOTE — Progress Notes
pt admitted to unit approx  at 0610. Pt arrived  to floor via cart accompanied by transport. pt alert and oriented x4 verbalizes needs and concerns appropriately. Pt oriented to room and surroundings. Orders verified, released and implemented. Pt c/o headache rates pain at 8/10  and aching in nature. Pt declined Oxycodone and requested Valium instead for her headache. No needs voiced at this time, pt resting quietly. Will continue with POC.

## 2017-02-13 NOTE — ED Notes
[id number] @ 0418. Please call Dawn, RN at [phone].

## 2017-02-14 ENCOUNTER — Encounter: Admit: 2017-02-14 | Discharge: 2017-02-14 | Payer: 59

## 2017-02-14 MED ORDER — TRASTUZUMAB IVPB
6 mg/kg | Freq: Once | INTRAVENOUS | 0 refills | Status: CN
Start: 2017-02-14 — End: ?

## 2017-02-15 ENCOUNTER — Encounter: Admit: 2017-02-15 | Discharge: 2017-02-15 | Payer: 59

## 2017-02-15 DIAGNOSIS — K5900 Constipation, unspecified: ICD-10-CM

## 2017-02-15 DIAGNOSIS — C50812 Malignant neoplasm of overlapping sites of left female breast: ICD-10-CM

## 2017-02-15 DIAGNOSIS — Z5111 Encounter for antineoplastic chemotherapy: ICD-10-CM

## 2017-02-15 DIAGNOSIS — R51 Headache: ICD-10-CM

## 2017-02-15 DIAGNOSIS — C50919 Malignant neoplasm of unspecified site of unspecified female breast: ICD-10-CM

## 2017-02-15 DIAGNOSIS — Z171 Estrogen receptor negative status [ER-]: ICD-10-CM

## 2017-02-15 DIAGNOSIS — C50912 Malignant neoplasm of unspecified site of left female breast: ICD-10-CM

## 2017-02-15 DIAGNOSIS — Z79810 Long term (current) use of selective estrogen receptor modulators (SERMs): ICD-10-CM

## 2017-02-15 DIAGNOSIS — C50811 Malignant neoplasm of overlapping sites of right female breast: Principal | ICD-10-CM

## 2017-02-15 DIAGNOSIS — M797 Fibromyalgia: Principal | ICD-10-CM

## 2017-02-15 DIAGNOSIS — Z789 Other specified health status: ICD-10-CM

## 2017-02-15 DIAGNOSIS — C773 Secondary and unspecified malignant neoplasm of axilla and upper limb lymph nodes: ICD-10-CM

## 2017-02-15 DIAGNOSIS — Z170 Estrogen receptor positive status [ER+]: ICD-10-CM

## 2017-02-15 DIAGNOSIS — M1990 Unspecified osteoarthritis, unspecified site: ICD-10-CM

## 2017-02-15 DIAGNOSIS — F4310 Post-traumatic stress disorder, unspecified: ICD-10-CM

## 2017-02-15 DIAGNOSIS — F329 Major depressive disorder, single episode, unspecified: ICD-10-CM

## 2017-02-15 LAB — COMPREHENSIVE METABOLIC PANEL
Lab: 0.4 mg/dL (ref 0.3–1.2)
Lab: 0.5 mg/dL (ref 0.4–1.00)
Lab: 104 MMOL/L — ABNORMAL LOW (ref 98–110)
Lab: 136 MMOL/L — ABNORMAL LOW (ref 137–147)
Lab: 3.7 g/dL (ref 3.5–5.0)
Lab: 3.9 MMOL/L — ABNORMAL LOW (ref 3.5–5.1)
Lab: 6.5 g/dL (ref 6.0–8.0)
Lab: 9 mg/dL — ABNORMAL HIGH (ref 7–25)
Lab: 9.2 mg/dL (ref 8.5–10.6)
Lab: 92 mg/dL — ABNORMAL HIGH (ref 70–100)

## 2017-02-15 LAB — CBC AND DIFF: Lab: 4.9 10*3/uL (ref 4.5–11.0)

## 2017-02-15 MED ORDER — FOSAPREPITANT 150MG/150ML NS IVPB
150 mg | Freq: Once | INTRAVENOUS | 0 refills | Status: CP
Start: 2017-02-15 — End: ?
  Administered 2017-02-15 (×2): 150 mg via INTRAVENOUS

## 2017-02-15 MED ORDER — DOCETAXEL IVPB
75 mg/m2 | Freq: Once | INTRAVENOUS | 0 refills | Status: CP
Start: 2017-02-15 — End: ?
  Administered 2017-02-15 (×2): 132.8 mg via INTRAVENOUS

## 2017-02-15 MED ORDER — DEXAMETHASONE IVPB
20 mg | Freq: Once | INTRAVENOUS | 0 refills | Status: CP
Start: 2017-02-15 — End: ?
  Administered 2017-02-15 (×2): 20 mg via INTRAVENOUS

## 2017-02-15 MED ORDER — PEGFILGRASTIM 6 MG/0.6ML SC SYRG
6 mg | Freq: Once | SUBCUTANEOUS | 0 refills | Status: CP
Start: 2017-02-15 — End: ?
  Administered 2017-02-15: 18:00:00 6 mg via SUBCUTANEOUS

## 2017-02-15 MED ORDER — TRASTUZUMAB IVPB
6 mg/kg | Freq: Once | INTRAVENOUS | 0 refills | Status: CP
Start: 2017-02-15 — End: ?
  Administered 2017-02-15 (×2): 408.7 mg via INTRAVENOUS

## 2017-02-15 MED ORDER — HEPARIN, PORCINE (PF) 100 UNIT/ML IV SYRG
500 [IU] | Freq: Once | INTRAVENOUS | 0 refills | Status: CP
Start: 2017-02-15 — End: ?
  Administered 2017-02-15: 18:00:00 500 [IU] via INTRAVENOUS

## 2017-02-15 MED ORDER — CARBOPLATIN IVPB (BY AUC-SWOG)
614.5 mg | Freq: Once | INTRAVENOUS | 0 refills | Status: CP
Start: 2017-02-15 — End: ?
  Administered 2017-02-15 (×2): 614.5 mg via INTRAVENOUS

## 2017-02-15 MED ORDER — OXYCODONE-ACETAMINOPHEN 5-325 MG PO TAB
1 | Freq: Once | ORAL | 0 refills | Status: CP
Start: 2017-02-15 — End: ?
  Administered 2017-02-15: 15:00:00 1 via ORAL

## 2017-02-15 MED ORDER — DEXAMETHASONE IVPB
20 mg | Freq: Once | INTRAVENOUS | 0 refills | Status: CN
Start: 2017-02-15 — End: ?

## 2017-02-15 MED ORDER — PALONOSETRON 0.25 MG/5 ML IV SOLN
.25 mg | Freq: Once | INTRAVENOUS | 0 refills | Status: CP
Start: 2017-02-15 — End: ?
  Administered 2017-02-15: 15:00:00 0.25 mg via INTRAVENOUS

## 2017-02-15 NOTE — Progress Notes
Date of Service: 02/15/2017      Subjective:             Reason for Visit:  Heme/Onc Care      Molly Randolph is a 46 y.o. female.    Cancer Staging  Malignant neoplasm of central portion of left breast in female, estrogen receptor negative (HCC)  Staging form: Breast, AJCC 8th Edition  - Clinical stage from 09/18/2016: Stage IA (cT1c, cN0, cM0, G3, ER: Negative, PR: Negative, HER2: Positive) - Signed by Judye Bos, PA-C on 09/18/2016  - Pathologic stage from 10/12/2016: Stage IIA (pT1c, pN1a, cM0, G3, ER: Negative, PR: Negative, HER2: Positive) - Signed by Judye Bos, PA-C on 10/12/2016    Malignant neoplasm of overlapping sites of right female breast [facility])  Staging form: Breast, AJCC 7th Edition  - Clinical: Stage IIA (T2, N0, cM0) - Signed by Gershon Mussel, APRN on 07/15/2014  - Pathologic: Stage IIA (T2, N0, cM0) - Signed by Dimas Alexandria, PA-C on 08/12/2014  - Pathologic: No stage assigned - Unsigned      History of Present Illness    DIAGNOSIS: Right breast cancer 07/2014  PAST ONCOLOGY HISTORY: [name] is a 46 year old pre menopausal female who lost a significant amount of weight in the Fall of 2015 and her breast size also significant decreased. In October 2015, she first noticed a depressed area superior to her left nipple, but thought that the finding was due to the weight loss and change in breast size. A month later, she noticed a mass in the same area. Bilateral diagnostic mammograms 06/07/14 ([place]) showed heterogeneously dense breast tissue. A spiculated mass was seen in the right breast 10-11:00 position, 2 cm FTN, anterior depth. There was an adjacent area architectural distortion seen only on CC tomosynthesis slice 39/64, slightly medial near 12:00, in the anterior central right breast. There were no suspicious masses, calcifications or architectural distortions in the left breast.  Right breast ultrasound 06/07/14 ([place]) showed a bilobed hypoechoic, irregular spiculated mass in the right breast extending from 11-12:00, 2 cm FTN with the dominant portion measuring 1.9 cm and a total inclusive distance of 3.4 cm. Right axillary survey demonstrates no suspicious adenopathy. BIRADS 5. On 07/05/14 she underwent a right breast biopsy that showed ILC, grade I, ER 90%, PR 98%, HER2 1+ IHC, Ki67 3%. On 08/04/14 Dr Nelson Chimes performed a right mastectomy and SLNB. Dr Fran Lowes placed a tissue expanders. Pathology showed ILC, grade 2, measuring 4.7cm.     She was seen on 09/11/16 for a new left breast mass. Left diagnostic mammogram 09/13/16 ([place]) revealed the area palpable concern as identified by the patient at 3:00, approximately 6-7 cm the nipple. At this location there was a subtle oval mass largely obscured by overlying glandular tissue. This measured approximately 1 cm but margins were poorly visualized. Further evaluation with ultrasound was performed. Targeted left breast ultrasound 09/13/16 ([place]) revealed at 3:00, 5 cm from nipple. At this location there was a lobulated hypoechoic mass with ill-defined margins measuring 1.3 cm x 0.8 cm x 1.1 cm. This was moderately suspicious and ultrasound-guided needle core biopsy was recommended. No axillary adenopathy was identified. On 09/14/16 left breast biopsy showed IDC, grade III, ER 0+, PR 0+, HER2 3+ IHC, Ki67 33%. She had left mastectomy and SLNB. Tumor was multifocal (1.7 cm, 0.2 cm, and multiple foci of microinvasion). 1/2 SLN was positive. ER/PR were 0% and HER2 was 3+ by IHC.     PRESENT THERAPY: Taxotere/Carbo/Herceptin.  She is here today for cycle #6 of TCH. The patient is at a high risk of developing chemotherapy neutropenia complications due to age/regimen. She will receive Neulasta 6mg  SC on day 1.   She went to the ED on 12/20/16 for constipation.   She was admitted on 02/12/17 for electrolyte abnormalities.      Review of Systems   Constitutional: Positive for fatigue and unexpected weight change (gain).   HENT: Negative.    Eyes: Negative. Respiratory: Negative.    Cardiovascular: Negative.    Gastrointestinal: Positive for constipation.   Genitourinary: Negative.    Musculoskeletal: Negative.    Skin: Negative.    Neurological: Positive for headaches.   Hematological: Negative.    Psychiatric/Behavioral: Negative.    All other systems reviewed and are negative.      No Known Allergies    Objective:         ??? ALPRAZolam (XANAX) 1 mg tablet Take 1 mg by mouth three times daily as needed for Anxiety.   ??? calcium carbonate (TUMS) 500 mg (200 mg elemental calcium) chewable tablet Chew 500 mg by mouth twice daily with meals.   ??? dexamethasone (DECADRON) 4 mg tablet Take 2 tablets by mouth twice daily. Take the day before, the day of, and the day after chemotherapy.   ??? docusate (COLACE) 100 mg capsule Take 1 capsule by mouth twice daily.   ??? folic acid (FOLVITE) 1 mg tablet Take one tablet by mouth daily.   ??? ibuprofen (ADVIL) 200 mg tablet Take 800 mg by mouth twice daily. Take with food.   ??? lidocaine/prilocaine (EMLA) 2.5/2.5 % topical cream Apply nickel size amount to port access site. Cover with plastic for 30-45 minutes. Wipe clean prior to accessing port   ??? LORazepam (ATIVAN) 1 mg tablet Take 1 tablet by mouth at bedtime as needed for Nausea, Vomiting or Other....   ??? prochlorperazine maleate (COMPAZINE) 10 mg tablet Take 1 tablet by mouth every 6 hours as needed.       Body mass index is 26.92 kg/m???.     Pain Score: Eight  Pain Loc: Head      Pain Addressed:  N/A    Patient Evaluated for a Clinical Trial: No treatment clinical trial available for this patient.     [place] Cooperative Oncology Group performance status is 1, Restricted in physically strenuous activity but ambulatory and able to carry out work of a light or sedentary nature, e.g., light house work, office work.     Physical Exam   Constitutional: She is oriented to person, place, and time. She appears well-developed and well-nourished.   HENT:   Head: Normocephalic. Nose: Nose normal.   Mouth/Throat: No oropharyngeal exudate.   Eyes: Conjunctivae are normal. Pupils are equal, round, and reactive to light.   Neck: Normal range of motion. Neck supple.   Cardiovascular: Normal rate, regular rhythm and normal heart sounds.    No murmur heard.  Pulmonary/Chest: Effort normal and breath sounds normal. No respiratory distress. She has no wheezes. She has no rales. She exhibits no tenderness. Right breast exhibits no inverted nipple, no mass, no nipple discharge, no skin change and no tenderness. Left breast exhibits no inverted nipple, no mass, no nipple discharge, no skin change and no tenderness.       Abdominal: Soft. Bowel sounds are normal. She exhibits no distension and no mass. There is no tenderness. There is no rebound and no guarding.   Musculoskeletal: Normal range of motion.  She exhibits no edema or tenderness.   Lymphadenopathy:     She has no cervical adenopathy.     She has no axillary adenopathy.        Right: No supraclavicular adenopathy present.        Left: No supraclavicular adenopathy present.   Neurological: She is alert and oriented to person, place, and time.   Skin: Skin is warm and dry. No rash noted.   Psychiatric: She has a normal mood and affect. Her behavior is normal.   Nursing note and vitals reviewed.       LABS/RADIOLOGY:  CBC w/Diff    Lab Results   Component Value Date/Time    WBC 4.9 02/15/2017 07:58 AM    RBC 3.14 (L) 02/15/2017 07:58 AM    HGB 10.9 (L) 02/15/2017 07:58 AM    HCT 31.4 (L) 02/15/2017 07:58 AM    MCV 100.1 (H) 02/15/2017 07:58 AM    MCH 34.8 (H) 02/15/2017 07:58 AM    MCHC 34.7 02/15/2017 07:58 AM    RDW 14.7 02/15/2017 07:58 AM    PLTCT 308 02/15/2017 07:58 AM    MPV 7.2 02/15/2017 07:58 AM    Lab Results   Component Value Date/Time    NEUT 60 02/15/2017 07:58 AM    ANC 3.00 02/15/2017 07:58 AM    LYMA 28 02/15/2017 07:58 AM    ALC 1.30 02/15/2017 07:58 AM    MONA 10 02/15/2017 07:58 AM    AMC 0.50 02/15/2017 07:58 AM EOSA 1 02/15/2017 07:58 AM    AEC 0.00 02/15/2017 07:58 AM    BASA 1 02/15/2017 07:58 AM    ABC 0.00 02/15/2017 07:58 AM        Comprehensive Metabolic Profile    Lab Results   Component Value Date/Time    NA 136 (L) 02/15/2017 07:58 AM    K 3.9 02/15/2017 07:58 AM    CL 104 02/15/2017 07:58 AM    CO2 28 02/15/2017 07:58 AM    GAP 4 02/15/2017 07:58 AM    BUN 9 02/15/2017 07:58 AM    CR 0.50 02/15/2017 07:58 AM    GLU 92 02/15/2017 07:58 AM    Lab Results   Component Value Date/Time    CA 9.2 02/15/2017 07:58 AM    PO4 3.3 02/12/2017 10:54 PM    ALBUMIN 3.7 02/15/2017 07:58 AM    TOTPROT 6.5 02/15/2017 07:58 AM    ALKPHOS 51 02/15/2017 07:58 AM    AST 22 02/15/2017 07:58 AM    ALT 16 02/15/2017 07:58 AM    TOTBILI 0.4 02/15/2017 07:58 AM    GFR >60 02/15/2017 07:58 AM    GFRAA >60 02/15/2017 07:58 AM        ECHO 10/31/16: EF 65%       Assessment and Plan:    1. Ms Farfan is a 46 year old post menopausal female with right breast ILC, grade I-II, measuring 4.7cm, ER/PR+ and HER2 negative.   2  Oncotype Dx RS was 10. The risk of 10 year distant recrrence on Tamoxifen is estimated to be 7%. Chemotherapy may provide an additional benefit of about 0% in terms to risk of recurrence. Chemotherapy was not recommended. She was started on Tamoxifen 20 mg 08/2014. She took tamoxifen off an on.   3. New left breast cancer 08/2016. On 09/14/16 left breast biopsy showed IDC, grade III, ER 0+, PR 0+, HER2 3+ IHC, Ki67 33%.  4. Left mastectomy and SLNB for a 1.7  cm cancer. I/2 SLN was positive.   5. Given the size and aggressive biology of the cancer, including HER2 positivity, we have recommended systemic chemotherapy.  I have recommended a  treatment regimen of Taxotere, Carboplatin, and Herceptin to be given every three weeks for six cycles.  This will be followed by Herceptin to complete a total treatment duration of 52 weeks.   6. She will receive cycle #6 of chemotherapy today. 7. We will request an ECHO every 3 months while on Herceptin; repeat 01/2017.    8. She stopped Tamoxifen when she started chemotherapy and then when chemotherapy is completed she will restart Tamoxifen.   9. Refer to radiation oncology now.   10. Headache; history of migraines. She was given Percocet in treatment today.   11. RTC in 3 weeks with ECHO, Herceptin and to discuss endocrine therapy.     Gershon Mussel, APRN

## 2017-02-15 NOTE — Progress Notes
CHEMO NOTE  Verified chemo consent signed and in chart.    Verified initiate chemo order in O2    Blood return positive via: Port (Single)    BSA and dose double checked (agree with orders as written) with: yes See MAR    Labs/applicable tests checked: CBC and Comprehensive Metabolic Panel (CMP)    Chemo regime: Drug/cycle/day    trastuzumab (HERCEPTIN) 408.7 mg in sodium chloride 0.9% (NS) 818.5631 mL IVPB     DOCEtaxel (TAXOTERE) 132.8 mg in sodium chloride 0.9% (NS) 263.28 mL IVPB     CARBOplatin (PARAPLATIN) 614.5 mg in sodium chloride 0.9% (NS) 311.45 mL IVPB    Rate verified and armband double checkwith second RN: yes    Patient education offered and stated understanding. Denies questions at this time.    Patient arrived to [facility] treatment for C6D1 [facility]. Pt seen in clinic today by Delsa Bern. Pt reports all needs addressed by provider, denies new concerns at this time. PAC accessed in lab, flushes, with positive blood return noted. Parameters met for treatment. Premedications and chemotherapy initiated per treatment plan. Neulasta given per MAR. Treatment completed, pt tolerated w/o adverse event. PAC flushed and de-accessed.  Pt left treatment ambulatory  w/o further questions or concerns.

## 2017-02-18 ENCOUNTER — Encounter: Admit: 2017-02-18 | Discharge: 2017-02-18 | Payer: 59

## 2017-02-18 DIAGNOSIS — C50112 Malignant neoplasm of central portion of left female breast: Principal | ICD-10-CM

## 2017-02-18 NOTE — Progress Notes
This encounter was created in error. Please disregard.

## 2017-03-02 ENCOUNTER — Ambulatory Visit: Admit: 2017-03-02 | Discharge: 2017-03-16 | Payer: 59

## 2017-03-02 DIAGNOSIS — Z171 Estrogen receptor negative status [ER-]: Secondary | ICD-10-CM

## 2017-03-06 ENCOUNTER — Encounter: Admit: 2017-03-06 | Discharge: 2017-03-06 | Payer: 59

## 2017-03-06 MED ORDER — CARISOPRODOL 350 MG PO TAB
350 mg | ORAL_TABLET | Freq: Three times a day (TID) | ORAL | 0 refills | Status: AC
Start: 2017-03-06 — End: 2017-05-20

## 2017-03-06 NOTE — Telephone Encounter
Patient called stating her fibromyalgia pain exacerbated and oxycodone caused more migraine headache. Her PCP had given her soma in the past for her pain and worked best for her. Discuss with S.LaFaver NP, prescription will leave it at [facility] level 2, patient will pick it up today. She was told we will give her a month of supply and after that she will need to follow up with her PCP for continuation. She voiced understanding.

## 2017-03-07 ENCOUNTER — Encounter: Admit: 2017-03-07 | Discharge: 2017-03-07 | Payer: 59

## 2017-03-07 DIAGNOSIS — Z171 Estrogen receptor negative status [ER-]: Secondary | ICD-10-CM

## 2017-03-08 ENCOUNTER — Encounter: Admit: 2017-03-08 | Discharge: 2017-03-08 | Payer: 59

## 2017-03-08 ENCOUNTER — Ambulatory Visit: Admit: 2017-03-08 | Discharge: 2017-03-08 | Payer: 59

## 2017-03-08 DIAGNOSIS — Z9013 Acquired absence of bilateral breasts and nipples: ICD-10-CM

## 2017-03-08 DIAGNOSIS — C773 Secondary and unspecified malignant neoplasm of axilla and upper limb lymph nodes: ICD-10-CM

## 2017-03-08 DIAGNOSIS — Z79810 Long term (current) use of selective estrogen receptor modulators (SERMs): ICD-10-CM

## 2017-03-08 DIAGNOSIS — C50919 Malignant neoplasm of unspecified site of unspecified female breast: ICD-10-CM

## 2017-03-08 DIAGNOSIS — C50112 Malignant neoplasm of central portion of left female breast: ICD-10-CM

## 2017-03-08 DIAGNOSIS — C50811 Malignant neoplasm of overlapping sites of right female breast: Principal | ICD-10-CM

## 2017-03-08 DIAGNOSIS — C50812 Malignant neoplasm of overlapping sites of left female breast: ICD-10-CM

## 2017-03-08 DIAGNOSIS — C50912 Malignant neoplasm of unspecified site of left female breast: ICD-10-CM

## 2017-03-08 DIAGNOSIS — Z171 Estrogen receptor negative status [ER-]: ICD-10-CM

## 2017-03-08 DIAGNOSIS — Z789 Other specified health status: ICD-10-CM

## 2017-03-08 DIAGNOSIS — F4310 Post-traumatic stress disorder, unspecified: ICD-10-CM

## 2017-03-08 DIAGNOSIS — M797 Fibromyalgia: Principal | ICD-10-CM

## 2017-03-08 DIAGNOSIS — Z170 Estrogen receptor positive status [ER+]: ICD-10-CM

## 2017-03-08 DIAGNOSIS — M1990 Unspecified osteoarthritis, unspecified site: ICD-10-CM

## 2017-03-08 DIAGNOSIS — F329 Major depressive disorder, single episode, unspecified: ICD-10-CM

## 2017-03-08 MED ORDER — HEPARIN, PORCINE (PF) 100 UNIT/ML IV SYRG
500 [IU] | Freq: Once | INTRAVENOUS | 0 refills | Status: CP
Start: 2017-03-08 — End: ?
  Administered 2017-03-08: 21:00:00 500 [IU] via INTRAVENOUS

## 2017-03-08 MED ORDER — TRASTUZUMAB IVPB
6 mg/kg | Freq: Once | INTRAVENOUS | 0 refills | Status: CP
Start: 2017-03-08 — End: ?
  Administered 2017-03-08 (×2): 408.7 mg via INTRAVENOUS

## 2017-03-08 MED ORDER — TAMOXIFEN 20 MG PO TAB
20 mg | ORAL_TABLET | Freq: Every day | ORAL | 3 refills | Status: AC
Start: 2017-03-08 — End: 2018-07-17

## 2017-03-08 NOTE — Progress Notes
NCT Number:  [id number]  Phase of study: II  Expected treatment start date: 03/18/2017  Outpatient or Inpatient: Outpatient     Study Coordinator: Tobias Alexander  Ordering Physician: Thea Gist  Diagnosis: Malignant neoplasm of overlapping sites of right breast in female, estrogen receptor positive  Diagnosis Code:   ICD-9-CM: 174.8, V86.0  ICD-10-CM: C50.811, Z17.0      Patient plans to participate in clinical trial: Hypofractionated radiation therapy for patients with breast cancer receiving regional nodal irradiation.     Drug/Dose/Frequency/Cycles:  Hypofractionation (Daily Monday through Friday for 4 Weeks  20 fractions)    Sub-Arm A: Chest Wall (for patients who have undergone mastectomy) 4005 cGy / 15 fractions / 267 cGy each fraction to chest wall and regional nodes      BOTH Sub-Arms: 1000 cGy boost/5 fractions/200 cGy each to lumpectomy bed / scar / undissected involved nodes. (This will start after the completion of the 4005 cGy listed above)    Premeds: N/A  Potential screening procedures: N/A    Radiation Therapy will be billed to insurance

## 2017-03-08 NOTE — Progress Notes
CHEMO NOTE  Verified chemo consent signed and in chart.    Verified initiate chemo order in O2    Blood return positive via: Port (Single and Accessed)    BSA and dose double checked (agree with orders as written) with: yes     Labs/applicable tests checked: ECHO    Chemo regime: Drug/cycle/day C7D1 Herceptin    Rate verified and armband double checkwith second RN: yes    Patient education offered and stated understanding. Denies questions at this time.    Port accessed. Parameters met for tx. Infusion completed without incident. Pt tolerated well. Denies needs/concerns at this time. Port flushed [employer]. Pt given copy of current AVS, dismissed from clinic, ambulatory.

## 2017-03-09 NOTE — Progress Notes
Here for simulation. Nursing completed education.

## 2017-03-11 ENCOUNTER — Encounter: Admit: 2017-03-11 | Discharge: 2017-03-11 | Payer: 59

## 2017-03-11 DIAGNOSIS — C50112 Malignant neoplasm of central portion of left female breast: Principal | ICD-10-CM

## 2017-03-11 NOTE — Progress Notes
Pt has active [employer]. Predetermination is required for participation in this Phase II (C9470) clinical trial. Request with signed consent and clinicals faxed to [facility]. Turn around time is up to 15 business days.    Payer:[employer]  Eff Date:07-02-16  In Network for Facility:yes  Ded:$350    Ded Remaining:$0  Coins %:85/15  OOP Max:$4000  OOP Remaining:$288.75  Copay:$0  Pre-x:No  JGG:[id number]  Contact source:Claudia at [place]

## 2017-03-11 NOTE — Progress Notes
Date of Service: 03/08/2017      Subjective:             Reason for Visit:  Heme/Onc Care      Molly Randolph is a 46 y.o. female.    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 In October 2015, she first noticed a depressed area superior to her left nipple, but thought that the finding was due to the weight loss and change in breast size. A month later, she noticed a mass in the same area. Bilateral diagnostic mammograms 06/07/14 ([facility]) showed heterogeneously dense breast tissue. A spiculated mass was seen in the right breast 10-11:00 position, 2 cm FTN, anterior depth. There was an adjacent area architectural distortion seen only on CC tomosynthesis slice 39/64, slightly medial near 12:00, in the anterior central right breast. There were no suspicious masses, calcifications or architectural distortions in the left breast.  Right breast ultrasound 06/07/14 ([place]) showed a bilobed hypoechoic, irregular spiculated mass in the right breast extending from 11-12:00, 2 cm FTN with the dominant portion measuring 1.9 cm and a total inclusive distance of 3.4 cm. Right axillary survey demonstrates no suspicious adenopathy. BIRADS 5. On 07/05/14 she underwent a right breast biopsy that showed ILC, grade I, ER 90%, PR 98%, HER2 1+ IHC, Ki67 3%. On 08/04/14 Dr Nelson Chimes performed a right mastectomy and SLNB. Dr Fran Lowes placed a tissue expanders. Pathology showed ILC, grade 2, measuring 4.7cm.     She was seen on 09/11/16 for a new left breast mass. Left diagnostic mammogram 09/13/16 ([place]) revealed the area palpable concern as identified by the patient at 3:00, approximately 6-7 cm the nipple. At this location there was a subtle oval mass largely obscured by overlying glandular tissue. This measured approximately 1 cm but margins were poorly visualized. Further evaluation with ultrasound was performed. Targeted left breast ultrasound 09/13/16 () revealed at 3:00, 5 cm from nipple. At this location there was a lobulated hypoechoic mass with ill-defined margins measuring 1.3 cm x 0.8 cm x 1.1 cm. This was moderately suspicious and ultrasound-guided needle core biopsy was recommended. No axillary adenopathy was identified. On 09/14/16 left breast biopsy showed IDC, grade III, ER 0+, PR 0+, HER2 3+ IHC, Ki67 33%. She had left mastectomy and SLNB. Tumor was multifocal (1.7 cm, 0.2 cm, and multiple foci of microinvasion). 1/2 SLN was positive. ER/PR were 0% and HER2 was 3+ by IHC. She completed Taxotere/Carbo/Herceptin; 02/15/17.  PRESENT THERAPY:  Herceptin every 3 weeks.     She is here today for continued treatment.         Review of Systems   Constitutional: Negative for fatigue and unexpected weight change.   HENT: Negative.    Eyes: Negative.    Respiratory: Negative.    Cardiovascular: Negative.    Gastrointestinal: Negative for constipation.   Genitourinary: Negative.    Musculoskeletal: Negative.    Skin: Negative.    Neurological: Negative for headaches. Hematological: Negative.    Psychiatric/Behavioral: Negative.    All other systems reviewed and are negative.      No Known Allergies    Objective:         ??? ALPRAZolam (XANAX) 1 mg tablet Take 1 mg by mouth three times daily as needed for Anxiety.   ??? calcium carbonate (TUMS) 500 mg (200 mg elemental calcium) chewable tablet Chew 500 mg by mouth twice daily with meals.   ??? carisoprodol(+) (SOMA) 350 mg tablet Take one tablet by mouth three times daily.   ??? dexamethasone (DECADRON) 4 mg tablet Take 2 tablets by mouth twice daily. Take the day before, the day of, and the day after chemotherapy.   ??? ibuprofen (ADVIL) 200 mg tablet Take 800 mg by mouth twice daily. Take with food.   ??? lidocaine/prilocaine (EMLA) 2.5/2.5 % topical cream Apply nickel size amount to port access site. Cover with plastic for 30-45 minutes. Wipe clean prior to accessing port   ??? LORazepam (ATIVAN) 1 mg tablet Take 1 tablet by mouth at bedtime as needed for Nausea, Vomiting or Other....   ??? prochlorperazine maleate (COMPAZINE) 10 mg tablet Take 1 tablet by mouth every 6 hours as needed.   ??? tamoxifen (NOLVADEX) 20 mg tablet Take one tablet by mouth daily.       Body mass index is 26.96 kg/m???.     Pain Score: Zero         Pain Addressed:  N/A    Patient Evaluated for a Clinical Trial: No treatment clinical trial available for this patient.     [place] Cooperative Oncology Group performance status is 1, Restricted in physically strenuous activity but ambulatory and able to carry out work of a light or sedentary nature, e.g., light house work, office work.     Physical Exam   Constitutional: She is oriented to person, place, and time. She appears well-developed and well-nourished.   HENT:   Head: Normocephalic.   Nose: Nose normal.   Mouth/Throat: No oropharyngeal exudate.   Eyes: Conjunctivae are normal. Pupils are equal, round, and reactive to light.   Neck: Normal range of motion. Neck supple. Cardiovascular: Normal rate, regular rhythm and normal heart sounds.    No murmur heard.  Pulmonary/Chest: Effort normal and breath sounds normal. No respiratory distress. She has no wheezes. She has no rales. She exhibits no tenderness. Right breast exhibits no inverted nipple, no mass, no nipple discharge, no skin change and no tenderness. Left breast exhibits no inverted nipple, no mass, no nipple discharge, no skin change and no tenderness.       Abdominal: Soft. Bowel sounds are normal. She exhibits no distension and no mass. There is no tenderness. There is no rebound and no guarding.   Musculoskeletal: Normal range of motion. She exhibits no edema or tenderness.   Lymphadenopathy:     She has no cervical adenopathy.     She has no axillary adenopathy.  Right: No supraclavicular adenopathy present.        Left: No supraclavicular adenopathy present.   Neurological: She is alert and oriented to person, place, and time.   Skin: Skin is warm and dry. No rash noted.   Psychiatric: She has a normal mood and affect. Her behavior is normal.   Nursing note and vitals reviewed.       LABS/RADIOLOGY:  CBC w/Diff    Lab Results   Component Value Date/Time    WBC 4.9 02/15/2017 07:58 AM    RBC 3.14 (L) 02/15/2017 07:58 AM    HGB 10.9 (L) 02/15/2017 07:58 AM    HCT 31.4 (L) 02/15/2017 07:58 AM    MCV 100.1 (H) 02/15/2017 07:58 AM    MCH 34.8 (H) 02/15/2017 07:58 AM    MCHC 34.7 02/15/2017 07:58 AM    RDW 14.7 02/15/2017 07:58 AM    PLTCT 308 02/15/2017 07:58 AM    MPV 7.2 02/15/2017 07:58 AM    Lab Results   Component Value Date/Time    NEUT 60 02/15/2017 07:58 AM    ANC 3.00 02/15/2017 07:58 AM    LYMA 28 02/15/2017 07:58 AM    ALC 1.30 02/15/2017 07:58 AM    MONA 10 02/15/2017 07:58 AM    AMC 0.50 02/15/2017 07:58 AM    EOSA 1 02/15/2017 07:58 AM    AEC 0.00 02/15/2017 07:58 AM    BASA 1 02/15/2017 07:58 AM    ABC 0.00 02/15/2017 07:58 AM        Comprehensive Metabolic Profile    Lab Results Component Value Date/Time    NA 136 (L) 02/15/2017 07:58 AM    K 3.9 02/15/2017 07:58 AM    CL 104 02/15/2017 07:58 AM    CO2 28 02/15/2017 07:58 AM    GAP 4 02/15/2017 07:58 AM    BUN 9 02/15/2017 07:58 AM    CR 0.50 02/15/2017 07:58 AM    GLU 92 02/15/2017 07:58 AM    Lab Results   Component Value Date/Time    CA 9.2 02/15/2017 07:58 AM    PO4 3.3 02/12/2017 10:54 PM    ALBUMIN 3.7 02/15/2017 07:58 AM    TOTPROT 6.5 02/15/2017 07:58 AM    ALKPHOS 51 02/15/2017 07:58 AM    AST 22 02/15/2017 07:58 AM    ALT 16 02/15/2017 07:58 AM    TOTBILI 0.4 02/15/2017 07:58 AM    GFR >60 02/15/2017 07:58 AM    GFRAA >60 02/15/2017 07:58 AM        ECHO 10/31/16: EF 65%  ECHO 03/08/17: EF 55%       Assessment and Plan:    1. Ms Vittitow is a 46 year old post menopausal female with right breast ILC, grade I-II, measuring 4.7cm, ER/PR+ and HER2 negative.   2  Oncotype Dx RS was 10. The risk of 10 year distant recrrence on Tamoxifen is estimated to be 7%. Chemotherapy may provide an additional benefit of about 0% in terms to risk of recurrence. Chemotherapy was not recommended. She was started on Tamoxifen 20 mg 08/2014. She took tamoxifen off an on.   3. New left breast cancer 08/2016. On 09/14/16 left breast biopsy showed IDC, grade III, ER 0+, PR 0+, HER2 3+ IHC, Ki67 33%.  4. Left mastectomy and SLNB for a 1.7 cm cancer. I/2 SLN was positive.   5. Given the size and aggressive biology of the cancer, including HER2 positivity, we have recommended systemic chemotherapy.  we  have recommended a  treatment regimen of Taxotere, Carboplatin, and Herceptin x6; completed 02/15/17. This will be followed by Herceptin to complete a total treatment duration of 52 weeks.   6. We will request an ECHO every 3 months while on Herceptin; repeat 06/2017.    7. She was told to restart Tamoxifen 20mg  daily now.   8. She will see radiation oncology on 03/18/17.   9. Headache; history of migraines.  10. We will schedule an EOT visit.   11. RTC in 3 months. Gershon Mussel, APRN    I have personally interviewed and examined the patient.  I have reviewed the history, physical, impression and plan outlined by Gershon Mussel NP.  HPI: The patient presents for continued breast cancer treatment.  On examination:  HEENT: No icterus, PERRL.  Oropharynx is normal; no exudate or lesions.  Neck: No JVD, supple.  Adenopathy: None.  Chest: CTA bilaterally.  CV: RRR without murmur.  Breasts:  Right: mastectomy   Left: mastectomy   Abdomen: Soft, non-distended,   Positive bowel sounds.  Skin: No rash.  Back: no pain with palpation over the T-spine.  Extremities: No clubbing or cyanosis.  Neuro: CN: II-XII intact; no sensory or motor abnormalities noted.   My impression: right breast cancer  My plan: Continue with Herceptin and Tamoxifen

## 2017-03-15 NOTE — Progress Notes
Spoke with [name] at [place] request is still in medical review, will ask for expedited review.

## 2017-03-16 ENCOUNTER — Ambulatory Visit: Admit: 2017-03-07 | Discharge: 2017-03-07 | Payer: 59

## 2017-03-16 DIAGNOSIS — Z006 Encounter for examination for normal comparison and control in clinical research program: ICD-10-CM

## 2017-03-16 DIAGNOSIS — Z171 Estrogen receptor negative status [ER-]: ICD-10-CM

## 2017-03-16 DIAGNOSIS — C50112 Malignant neoplasm of central portion of left female breast: Principal | ICD-10-CM

## 2017-03-17 DIAGNOSIS — C50811 Malignant neoplasm of overlapping sites of right female breast: Secondary | ICD-10-CM

## 2017-03-17 NOTE — Progress Notes
ATTESTATION    I personally performed the key portions of the E/M visit, discussed case with Stephanie Lafaver, ARNP and concur with her documentation of history, physical exam, assessment, and treatment plan unless otherwise noted.    Staff name:  Lillyann Ahart J Lacole Komorowski, MD Date:  03/17/2017

## 2017-03-18 ENCOUNTER — Encounter: Admit: 2017-03-18 | Discharge: 2017-03-18 | Payer: 59

## 2017-03-18 NOTE — Progress Notes
Per [name] at [facility] [facility]  [phone] pt has been approved for participation in this Phase II clinical trial. Authorization #[id number] valid from 03-11-17 thru 03-15-18. Pt is okay to proceed.

## 2017-03-19 ENCOUNTER — Ambulatory Visit: Admit: 2017-03-19 | Discharge: 2017-03-19 | Payer: 59

## 2017-03-19 ENCOUNTER — Encounter: Admit: 2017-03-19 | Discharge: 2017-03-19 | Payer: 59

## 2017-03-19 NOTE — Progress Notes
03/19/2017  Molly Randolph  [id number]  [birth year]    Here for weekly management. Pt has upper respiratory infection and has "felt awful today and last night." C/o sore throat. Denies fatigue. Pt hasn't started skin care yet, reinforced skin care teaching at this time. Pt would like to use Aquaphor BID.            Nena Jordan, RN

## 2017-03-19 NOTE — Progress Notes
Weekly Management Progress Note    Date:  03/19/2017    Molly Randolph is a 46 y.o. female.     Vitals:  Vitals:    03/19/17 1341   BP: (!) 154/110   Pulse: 97   Resp: 14   Temp: 36.7 ???C (98 ???F)   TempSrc: Temporal   SpO2: 98%   Weight: 70.9 kg (156 lb 6.4 oz)     Subjective   The encounter diagnosis was Malignant neoplasm of overlapping sites of right breast in female, estrogen receptor positive (HCC).  Staging: Cancer Staging  Malignant neoplasm of central portion of left breast in female, estrogen receptor negative (HCC)  Staging form: Breast, AJCC 8th Edition  - Clinical stage from 09/18/2016: Stage IA (cT1c, cN0, cM0, G3, ER: Negative, PR: Negative, HER2: Positive) - Signed by Judye Bos, PA-C on 09/18/2016  - Pathologic stage from 10/12/2016: Stage IIA (pT1c, pN1a, cM0, G3, ER: Negative, PR: Negative, HER2: Positive) - Signed by Judye Bos, PA-C on 10/12/2016    Malignant neoplasm of overlapping sites of right female breast [facility])  Staging form: Breast, AJCC 7th Edition  - Clinical: Stage IIA (T2, N0, cM0) - Signed by Gershon Mussel, APRN on 07/15/2014  - Pathologic: Stage IIA (T2, N0, cM0) - Signed by Dimas Alexandria, PA-C on 08/12/2014  - Pathologic: No stage assigned - Unsigned      Treatment Data Summary:      Treatment Data 03/18/2017 03/18/2017 03/19/2017 03/19/2017   Course ID C1 LT SCV/AX/CW - C1 LT SCV/AX/CW -   Plan ID LT SCV -Fi LT CW-FiF LT SCV -Fi LT CW BOLUS   Prescription Dose (cGy) 4,005 2,670 4,005 1,335   Prescribed Dose per Fraction (Gy) 2.67 2.67 2.67 2.67   Fractions Treated to Date 1 1 2 1    Total Fractions on Plan 15 10 15 5    Treatment Elapsed Days 0 - 1 -   Reference Point ID LT SCV LT CW LT SCV LT CW   Dosage Given to Date (Gy) 2.67 2.67 5.34 5.34       Subjective:    Patient endorses developing a sore throat since yesterday, with rhinorrhea, rare cough   No fevers, no recent sick contacts   Feeling very fatigued; she tried some ibuprofen but has not tried any other medications Feels like her sore throat has improved since last night   Recently was out watching a softball game and got sunburned on the back of her neck       Objective:   GEN: Tired female in NAD, answers questions appropriately  HEENT: treatment associated alopecia, moist mucous membranes, no erythema in oropharynx observed, prior tonsillectomies   CV: RRR, no murmurs/rubs/gallops  PULM: CTAB, no crackles or wheezes, occasional cough   SKIN: Warm and slightly erythematous skin diffusely on bilateral posterior shoulders and neck      Assessment:  Molly Randolph is a 46 y.o. female with invasive ductal carcinoma of the left breast. She is tolerating radiation; having more fatigue in setting of suspected upper respiratory infection.     Plan:   We discussed using ibuprofen, benadryl for symptomatic relief and drinking plenty of fluids  If symptoms persist or worsen patient was agreeable to starting codeine cough syrup, antibiotics   Continue radiation           ATTESTATION    I personally performed the key portions of the E/M visit, discussed case with resident and concur with resident documentation of history, physical exam,  assessment, and treatment plan unless otherwise noted.    Staff name:  Smiley Houseman, MD Date:  03/20/2017

## 2017-03-20 ENCOUNTER — Encounter: Admit: 2017-03-20 | Discharge: 2017-03-20 | Payer: 59

## 2017-03-21 ENCOUNTER — Encounter: Admit: 2017-03-21 | Discharge: 2017-03-21 | Payer: 59

## 2017-03-21 MED ORDER — AZITHROMYCIN 250 MG PO TAB
ORAL_TABLET | Freq: Every day | 0 refills | Status: AC
Start: 2017-03-21 — End: 2017-03-29

## 2017-03-21 NOTE — Telephone Encounter
Received a page, patient reported fever of 100 F this morning with cough and sore throat. Patient denies chills, aching just felt like sinus running down her throat. I have advised patient continue pushing po fluid intake and she can take medicine over the counter such as mucinex or cough medicine. I have told her it's  allergy season and it could be virus and virus may take up to 2 weeks. We typically do not prescribe antibiotic for virus infection. I told patient, both provider are out of the clinic today, if her cough persist we could do a chest CXR tomorrow. Patient told me she is going to contact [place] office for prescription.

## 2017-03-21 NOTE — Telephone Encounter
Patient called with Temp of 100 degrees and productive cough. Patient unsure of color of sputum. Patient states "I just spit it out and don't look at it." Dr. Alroy Dust notified. Antibiotic escribed to [facility] on [address]. Patient verbalized understanding and has call back number of [phone].

## 2017-03-25 ENCOUNTER — Encounter: Admit: 2017-03-25 | Discharge: 2017-03-25 | Payer: 59

## 2017-03-26 ENCOUNTER — Encounter: Admit: 2017-03-26 | Discharge: 2017-03-26 | Payer: 59

## 2017-03-26 MED ORDER — MOMETASONE 0.1 % TP CREA
TOPICAL | 1 refills | 30.00000 days | Status: AC | PRN
Start: 2017-03-26 — End: 2018-01-29

## 2017-03-26 NOTE — Progress Notes
Weekly Management Progress Note    Date:  03/26/2017    Molly Randolph is a 46 y.o. female.     Vitals:  Vitals:    03/26/17 1714   BP: (!) 167/95   Temp: 36.4 ???C (97.5 ???F)   Weight: 70.1 kg (154 lb 9.6 oz)     Subjective   The encounter diagnosis was Malignant neoplasm of overlapping sites of right breast in female, estrogen receptor positive (HCC).  Staging: Cancer Staging  Malignant neoplasm of central portion of left breast in female, estrogen receptor negative (HCC)  Staging form: Breast, AJCC 8th Edition  - Clinical stage from 09/18/2016: Stage IA (cT1c, cN0, cM0, G3, ER: Negative, PR: Negative, HER2: Positive) - Signed by Judye Bos, PA-C on 09/18/2016  - Pathologic stage from 10/12/2016: Stage IIA (pT1c, pN1a, cM0, G3, ER: Negative, PR: Negative, HER2: Positive) - Signed by Judye Bos, PA-C on 10/12/2016    Malignant neoplasm of overlapping sites of right female breast [facility])  Staging form: Breast, AJCC 7th Edition  - Clinical: Stage IIA (T2, N0, cM0) - Signed by Gershon Mussel, APRN on 07/15/2014  - Pathologic: Stage IIA (T2, N0, cM0) - Signed by Dimas Alexandria, PA-C on 08/12/2014  - Pathologic: No stage assigned - Unsigned      Treatment Data Summary:      Treatment Data 03/19/2017 03/20/2017 03/20/2017 03/25/2017 03/25/2017 03/26/2017 03/26/2017   Course ID - C1 LT SCV/AX/CW - C1 LT SCV/AX/CW - C1 LT SCV/AX/CW -   Plan ID LT CW BOLUS LT SCV -Fi LT CW-FiF LT SCV -Fi LT CW BOLUS LT SCV -Fi LT CW-FiF   Prescription Dose (cGy) 1,335 4,005 2,670 4,005 1,335 4,005 2,670   Prescribed Dose per Fraction (Gy) 2.67 2.67 2.67 2.67 2.67 2.67 2.67   Fractions Treated to Date 1 3 2 4 2 5 3    Total Fractions on Plan 5 15 10 15 5 15 10    Treatment Elapsed Days - 2 - 7 - 8 -   Reference Point ID LT CW LT SCV LT CW LT SCV LT CW LT SCV LT CW   Dosage Given to Date (Gy) 5.34 8.01 8.01 10.68 10.68 13.35 13.35       Subjective:    Has tried tinactin spray for what she suspected was athlete's foot on the base of her left foot; endorses stinging but no resolution of pruritis.   This has been going on for the last month.   She has noticed some erythema of her left reconstructed breast      Objective:   GEN: Pleasant female in NAD, answers questions appropriately  CHEST: Left breast with slight hyperpigmentation, no axillary or shoulder erythema, scar above left nipple   EXT: No upper extremity edema bilaterally, left foot base has rash on bottom of her feet      Assessment: Molly Randolph is a 46 year old female who presents with left breast cancer. Tolerating radiation. Left foot with new rash.     Plan:   Trial of mometasone on breast and can also use on base of left foot.  If no improvement, will prescribe antifungal.  Continue radiation. Port films reviewed.    ATTESTATION    I personally performed the key portions of the E/M visit, discussed case with resident and concur with resident documentation of history, physical exam, assessment, and treatment plan unless otherwise noted.    Staff name:  Smiley Houseman, MD Date:  03/26/2017

## 2017-03-28 ENCOUNTER — Encounter: Admit: 2017-03-28 | Discharge: 2017-03-28 | Payer: 59

## 2017-03-29 ENCOUNTER — Encounter: Admit: 2017-03-29 | Discharge: 2017-03-29 | Payer: 59

## 2017-03-29 ENCOUNTER — Encounter: Admit: 2017-03-29 | Discharge: 2017-03-30 | Payer: 59

## 2017-03-29 ENCOUNTER — Ambulatory Visit: Admit: 2017-03-26 | Discharge: 2017-03-26 | Payer: 59

## 2017-03-29 MED ORDER — TRASTUZUMAB IVPB
6 mg/kg | Freq: Once | INTRAVENOUS | 0 refills | Status: CP
Start: 2017-03-29 — End: ?
  Administered 2017-03-29 (×2): 408.7 mg via INTRAVENOUS

## 2017-03-29 MED ORDER — HEPARIN, PORCINE (PF) 100 UNIT/ML IV SYRG
500 [IU] | Freq: Once | INTRAVENOUS | 0 refills | Status: CP
Start: 2017-03-29 — End: ?
  Administered 2017-03-29: 18:00:00 500 [IU] via INTRAVENOUS

## 2017-03-29 MED ORDER — ALTEPLASE 2 MG IK SOLR
2 mg | Freq: Once | INTRAMUSCULAR | 0 refills | Status: CP
Start: 2017-03-29 — End: ?
  Administered 2017-03-29: 16:00:00 2 mg via INTRAMUSCULAR

## 2017-03-29 NOTE — Progress Notes
CHEMO NOTE  Verified chemo consent signed and in chart.    Verified initiate chemo order in O2    Blood return positive via: Port (Single, Power Port and Accessed)    BSA and dose double checked (agree with orders as written) with: yes see MAR    Labs/applicable tests checked: CBC and Comprehensive Metabolic Panel (CMP)    Chemo regime: Drug/cycle/daytrastuzumab (HERCEPTIN) 408.7 mg in sodium chloride 0.9% (NS) 284.1324 mL IVPB  [[id number]]     Rate verified and armband double checkwith second RN: yes    Patient education offered and stated understanding. Denies questions at this time.    Pt here for Herceptin.  PAC accessed and flushed w NS, no blood return. Port reaccessed, still no blood return.  Alteplase given, rechecked 40 minutes later and good blood return noted. Pt tolerated infusion well.  Port flushed per protocol and deaccessed.   Pt left ambulatory, no complaints.

## 2017-03-31 ENCOUNTER — Ambulatory Visit: Admit: 2017-03-17 | Discharge: 2017-03-31 | Payer: 59

## 2017-03-31 DIAGNOSIS — Z170 Estrogen receptor positive status [ER+]: ICD-10-CM

## 2017-03-31 DIAGNOSIS — C773 Secondary and unspecified malignant neoplasm of axilla and upper limb lymph nodes: ICD-10-CM

## 2017-03-31 DIAGNOSIS — C50811 Malignant neoplasm of overlapping sites of right female breast: ICD-10-CM

## 2017-03-31 DIAGNOSIS — Z171 Estrogen receptor negative status [ER-]: ICD-10-CM

## 2017-03-31 DIAGNOSIS — C50812 Malignant neoplasm of overlapping sites of left female breast: ICD-10-CM

## 2017-03-31 DIAGNOSIS — Z006 Encounter for examination for normal comparison and control in clinical research program: ICD-10-CM

## 2017-03-31 DIAGNOSIS — C50112 Malignant neoplasm of central portion of left female breast: Principal | ICD-10-CM

## 2017-04-01 ENCOUNTER — Ambulatory Visit: Admit: 2017-04-01 | Discharge: 2017-04-15 | Payer: 59

## 2017-04-01 ENCOUNTER — Encounter: Admit: 2017-04-01 | Discharge: 2017-04-01 | Payer: 59

## 2017-04-01 DIAGNOSIS — C50112 Malignant neoplasm of central portion of left female breast: Secondary | ICD-10-CM

## 2017-04-02 ENCOUNTER — Encounter: Admit: 2017-04-02 | Discharge: 2017-04-02 | Payer: 59

## 2017-04-04 ENCOUNTER — Encounter: Admit: 2017-04-04 | Discharge: 2017-04-04 | Payer: 59

## 2017-04-04 ENCOUNTER — Ambulatory Visit: Admit: 2017-04-04 | Discharge: 2017-04-04 | Payer: 59

## 2017-04-04 DIAGNOSIS — C50811 Malignant neoplasm of overlapping sites of right female breast: ICD-10-CM

## 2017-04-04 NOTE — Progress Notes
Weekly Management Progress Note    Date:  04/04/2017    Molly Randolph is a 46 y.o. female.     Vitals:  Vitals:    04/04/17 1748   BP: (!) 190/118   Pulse: 90   Temp: 36.2 ???C (97.2 ???F)   Weight: 68.5 kg (151 lb)   Height: 165.1 cm (65)     Subjective   The encounter diagnosis was Malignant neoplasm of overlapping sites of right breast in female, estrogen receptor positive (HCC).  Staging: Cancer Staging  Malignant neoplasm of central portion of left breast in female, estrogen receptor negative (HCC)  Staging form: Breast, AJCC 8th Edition  - Clinical stage from 09/18/2016: Stage IA (cT1c, cN0, cM0, G3, ER: Negative, PR: Negative, HER2: Positive) - Signed by Judye Bos, PA-C on 09/18/2016  - Pathologic stage from 10/12/2016: Stage IIA (pT1c, pN1a, cM0, G3, ER: Negative, PR: Negative, HER2: Positive) - Signed by Judye Bos, PA-C on 10/12/2016    Malignant neoplasm of overlapping sites of right female breast [facility])  Staging form: Breast, AJCC 7th Edition  - Clinical: Stage IIA (T2, N0, cM0) - Signed by Gershon Mussel, APRN on 07/15/2014  - Pathologic: Stage IIA (T2, N0, cM0) - Signed by Dimas Alexandria, PA-C on 08/12/2014  - Pathologic: No stage assigned - Unsigned      Treatment Data Summary:      Treatment Data 03/28/2017 03/29/2017 03/29/2017 04/01/2017 04/01/2017 04/04/2017 04/04/2017   Course ID - C1 LT SCV/AX/CW - C1 LT SCV/AX/CW - C1 LT SCV/AX/CW -   Plan ID LT CW BOLUS LT SCV -Fi LT CW-FiF LT SCV -Fi LT CW BOLUS LT SCV -Fi LT CW-FiF   Prescription Dose (cGy) 1,335 4,005 2,670 4,005 1,335 4,005 2,670   Prescribed Dose per Fraction (Gy) 2.67 2.67 2.67 2.67 2.67 2.67 2.67   Fractions Treated to Date 3 7 4 8 4 9 5    Total Fractions on Plan 5 15 10 15 5 15 10    Treatment Elapsed Days - 11 - 14 - 17 -   Reference Point ID LT CW LT SCV LT CW LT SCV LT CW LT SCV LT CW   Dosage Given to Date (Gy) 16.02 18.69 18.69 21.36 21.36 24.03 24.03       Subjective: She has right toenail pain and feels like it is either starting to get ingrown or might start falling off  There is now pain at the base of the left foot, she did not feel like this was improved with mometasone cream. 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   She is worried if she is starting to get peripheral neuropathy this week. She was ambivalent about having issues with balance, though on further questioning the hesitation was due to some lightheadedness rather than difficulty with balance from peripheral neuropathy.   She is working full time again as a Paramedic  Her boyfriend is a brittle diabetic and she had to call an ambulance on him this AM as his glucose was too low  Feels fatigued and has joint pain, she is on tamoxifen. She also has fibromyalgia, PTSD.   Endorses anxiety, insomnia, and weight loss; she takes xanax regularly, but did not sleep at all last night.  Denies tenderness or pruritis on her left chest      Objective:  GEN: Anxious woman in NAD, answers questions appropriately  CHEST: Bilateral implants, right  chest wall erythematous in treatment field distribution, no tenderness to palpation, no skin desquamation  EXTREMITIES: Coarse toenail on right foot, scattered slightly hyperpigmented tiny papules on base of left foot     Due to hx of bilateral mastectomy; patient's blood pressure is taken in her legs.     SKIN: Grade I     Assessment:   Molly Randolph is a 46 year old female who presents with left breast cancer. She is having a lot of anxiety, fatigue with her post chemotherapy symptoms and social situation. From the radiation point of view she is tolerating treatment. Concern for neuropathy in the feet discussed.     Plan:  Continue radiation  Discussed starting multivitamins   On further discussion, peripheral neuropathy may be minor and may also be pain related to toenails.  Discussed gabapentin; pros and cons, will hold off at this time.   Educated patient on medication, relaxation techniques     ATTESTATION    I personally performed the key portions of the E/M visit, discussed case with resident and concur with resident documentation of history, physical exam, assessment, and treatment plan unless otherwise noted.    Staff name:  Smiley Houseman, MD Date:  04/05/2017

## 2017-04-04 NOTE — Progress Notes
04/04/2017  Molly Randolph  [id number]  1970/07/23    Here for weekly management. Skin on left breast dry and intact. Patient is stressed with work and sick significant other. Patient complains of "leathery skin" on soles of feet.             Milus Banister, RN

## 2017-04-05 ENCOUNTER — Encounter: Admit: 2017-04-05 | Discharge: 2017-04-05 | Payer: 59

## 2017-04-08 ENCOUNTER — Encounter: Admit: 2017-04-08 | Discharge: 2017-04-08 | Payer: 59

## 2017-04-10 ENCOUNTER — Encounter: Admit: 2017-04-10 | Discharge: 2017-04-10 | Payer: 59

## 2017-04-11 ENCOUNTER — Encounter: Admit: 2017-04-11 | Discharge: 2017-04-11 | Payer: 59

## 2017-04-11 DIAGNOSIS — C50811 Malignant neoplasm of overlapping sites of right female breast: ICD-10-CM

## 2017-04-11 MED ORDER — GABAPENTIN 300 MG PO CAP
300 mg | ORAL_CAPSULE | ORAL | 3 refills | Status: AC
Start: 2017-04-11 — End: 2018-01-29

## 2017-04-12 NOTE — Progress Notes
Weekly Management Progress Note    Date:  04/11/2017    Molly Randolph is a 46 y.o. female.     Vitals:  Vitals:    04/11/17 1736   BP: (!) 173/91   Pulse: 72   Resp: 21   Temp: 36.4 ???C (97.6 ???F)   TempSrc: Temporal   SpO2: 100%   Weight: 70.1 kg (154 lb 9.6 oz)     Subjective   The encounter diagnosis was Malignant neoplasm of overlapping sites of right breast in female, estrogen receptor positive (HCC).  Staging: Cancer Staging  Malignant neoplasm of central portion of left breast in female, estrogen receptor negative (HCC)  Staging form: Breast, AJCC 8th Edition  - Clinical stage from 09/18/2016: Stage IA (cT1c, cN0, cM0, G3, ER: Negative, PR: Negative, HER2: Positive) - Signed by Judye Bos, PA-C on 09/18/2016  - Pathologic stage from 10/12/2016: Stage IIA (pT1c, pN1a, cM0, G3, ER: Negative, PR: Negative, HER2: Positive) - Signed by Judye Bos, PA-C on 10/12/2016    Malignant neoplasm of overlapping sites of right female breast [facility])  Staging form: Breast, AJCC 7th Edition  - Clinical: Stage IIA (T2, N0, cM0) - Signed by Gershon Mussel, APRN on 07/15/2014  - Pathologic: Stage IIA (T2, N0, cM0) - Signed by Dimas Alexandria, PA-C on 08/12/2014  - Pathologic: No stage assigned - Unsigned      Treatment Data Summary:      Treatment Data 04/05/2017 04/08/2017 04/08/2017 04/10/2017 04/10/2017 04/11/2017 04/11/2017   Course ID - C1 LT SCV/AX/CW - C1 LT SCV/AX/CW - C1 LT SCV/AX/CW -   Plan ID LT CW BOLUS LT SCV -Fi LT CW-FiF LT SCV -Fi LT CW-FiF LT SCV -Fi LT CW-FiF   Prescription Dose (cGy) 1,335 4,005 2,670 4,005 2,670 4,005 2,670   Prescribed Dose per Fraction (Gy) 2.67 2.67 2.67 2.67 2.67 2.67 2.67   Fractions Treated to Date 5 11 6 12 7 13 8    Total Fractions on Plan 5 15 10 15 10 15 10    Treatment Elapsed Days - 21 - 23 - 24 -   Reference Point ID LT CW LT SCV LT CW LT SCV LT CW LT SCV LT CW   Dosage Given to Date (Gy) 26.7 29.37 29.37 32.04 32.04 34.71 34.71 Subjective:  Patient doing better this week.  Broke up with boyfriend and now sleeping better at night.  Continues to have a lot of pain in her feet, especially with her line of work.  This does not respond to tylenol, ibuprofen, tramadol, oxycodone or hydrocodone.  No chest complaints.       Objective: Vitals as above  GENERAL: Patient pleasant, no apparent distress.  SKIN: There is mild hyperpigmentation over the treated breast without significant desquamation.     Assessment: 46 year old female with left breast cancer.    Plan: Continue radiation therapy. Port films reviewed. Continue skin care.  Will prescribe gabapentin for neuropathy.

## 2017-04-15 ENCOUNTER — Encounter: Admit: 2017-04-15 | Discharge: 2017-04-15 | Payer: 59

## 2017-04-15 ENCOUNTER — Ambulatory Visit: Admit: 2017-04-11 | Discharge: 2017-04-11 | Payer: 59

## 2017-04-15 DIAGNOSIS — Z006 Encounter for examination for normal comparison and control in clinical research program: ICD-10-CM

## 2017-04-15 DIAGNOSIS — C50112 Malignant neoplasm of central portion of left female breast: Principal | ICD-10-CM

## 2017-04-15 NOTE — Telephone Encounter
Follow up care with patient regarding her neuropathy pain. Patient said " gabapentin gave her a good relief. She has noticed lots of improvement. She was advised to call our office if her symptoms got worse". She voiced understanding.

## 2017-04-16 ENCOUNTER — Encounter: Admit: 2017-04-16 | Discharge: 2017-04-16 | Payer: 59

## 2017-04-16 DIAGNOSIS — C50811 Malignant neoplasm of overlapping sites of right female breast: Secondary | ICD-10-CM

## 2017-04-16 DIAGNOSIS — C50112 Malignant neoplasm of central portion of left female breast: Secondary | ICD-10-CM

## 2017-04-16 NOTE — Progress Notes
04/16/2017  Molly Randolph  [id number]  [birth year]    Here for weekly management. Skin on left breast and axilla, mild erythema, dry and intact.EOT next week, EOT teaching done and one month follow up given.            Milus Banister, RN

## 2017-04-17 NOTE — Progress Notes
Weekly Management Progress Note    Date:  04/16/2017    Molly Randolph is a 46 y.o. female.     Vitals:  Vitals:    04/16/17 1757   BP: (!) 200/110   Pulse: 58   Temp: 36.6 ???C (97.8 ???F)   Weight: 70.3 kg (155 lb)   Height: 167.6 cm (66)     Subjective   The encounter diagnosis was Malignant neoplasm of overlapping sites of right breast in female, estrogen receptor positive (HCC).  Staging: Cancer Staging  Malignant neoplasm of central portion of left breast in female, estrogen receptor negative (HCC)  Staging form: Breast, AJCC 8th Edition  - Clinical stage from 09/18/2016: Stage IA (cT1c, cN0, cM0, G3, ER: Negative, PR: Negative, HER2: Positive) - Signed by Judye Bos, PA-C on 09/18/2016  - Pathologic stage from 10/12/2016: Stage IIA (pT1c, pN1a, cM0, G3, ER: Negative, PR: Negative, HER2: Positive) - Signed by Judye Bos, PA-C on 10/12/2016    Malignant neoplasm of overlapping sites of right female breast [facility])  Staging form: Breast, AJCC 7th Edition  - Clinical: Stage IIA (T2, N0, cM0) - Signed by Gershon Mussel, APRN on 07/15/2014  - Pathologic: Stage IIA (T2, N0, cM0) - Signed by Dimas Alexandria, PA-C on 08/12/2014  - Pathologic: No stage assigned - Unsigned      Treatment Data Summary:      Treatment Data 04/10/2017 04/11/2017 04/11/2017 04/15/2017 04/15/2017 04/16/2017 04/16/2017   Course ID - C1 LT SCV/AX/CW - C1 LT SCV/AX/CW - C1 LT SCV/AX/CW -   Plan ID LT CW-FiF LT SCV -Fi LT CW-FiF LT SCV -Fi LT CW-FiF LT SCV -Fi LT CW-FiF   Prescription Dose (cGy) 2,670 4,005 2,670 4,005 2,670 4,005 2,670   Prescribed Dose per Fraction (Gy) 2.67 2.67 2.67 2.67 2.67 2.67 2.67   Fractions Treated to Date 7 13 8 14 9 15 10    Total Fractions on Plan 10 15 10 15 10 15 10    Treatment Elapsed Days - 24 - 28 - 29 -   Reference Point ID LT CW LT SCV LT CW LT SCV LT CW LT SCV LT CW   Dosage Given to Date (Gy) 32.04 34.71 34.71 37.38 37.38 40.05 40.05 Subjective:  Patient feels 100% better this week.  She is sleeping better with the gabapentin and she states that pain in her feet is gone this week.  More energy. No breast complaints.     Objective: Vitals as above  GENERAL: Patient pleasant, no apparent distress.  SKIN: There is mild hyperpigmentation over the treated breast without significant desquamation.     Assessment: 46 year old female with a diagnosis of left breast cancer.    Plan: Continue radiation therapy. Port films reviewed. Continue skin care.

## 2017-04-18 ENCOUNTER — Encounter: Admit: 2017-04-18 | Discharge: 2017-04-18 | Payer: 59

## 2017-04-19 ENCOUNTER — Encounter: Admit: 2017-04-19 | Discharge: 2017-04-20 | Payer: 59

## 2017-04-19 ENCOUNTER — Encounter: Admit: 2017-04-19 | Discharge: 2017-04-19 | Payer: 59

## 2017-04-19 DIAGNOSIS — C50112 Malignant neoplasm of central portion of left female breast: Principal | ICD-10-CM

## 2017-04-19 MED ORDER — HEPARIN, PORCINE (PF) 100 UNIT/ML IV SYRG
500 [IU] | Freq: Once | INTRAVENOUS | 0 refills | Status: CP
Start: 2017-04-19 — End: ?
  Administered 2017-04-19: 17:00:00 500 [IU] via INTRAVENOUS

## 2017-04-19 MED ORDER — TRASTUZUMAB IVPB
6 mg/kg | Freq: Once | INTRAVENOUS | 0 refills | Status: CP
Start: 2017-04-19 — End: ?
  Administered 2017-04-19 (×2): 408.7 mg via INTRAVENOUS

## 2017-04-19 NOTE — Progress Notes
Patient arrived to CC treatment for D1C9 of Herceptin. Patient questions and concerns were addressed. Port accessed in treatment area with positive blood return. Parameters checked and met for treatment. Patient received treatment as ordered without incidence. Patient's port flushed and deaccessed per protocol. Patient given AVS and labs. Patient discharged ambulatory with no questions or concerns.  Marland Kitchen  CHEMO NOTE  Verified chemo consent signed and in chart.    Verified initiate chemo order in O2    Blood return positive via: Port (Single)    BSA and dose double checked (agree with orders as written) with: yes     Labs/applicable tests checked: CBC and Comprehensive Metabolic Panel (CMP)    Chemo regime: Drug/cycle/day P7T0 Herceptin    Rate verified and armband double checkwith second RN: yes    Patient education offered and stated understanding. Denies questions at this time.

## 2017-04-23 ENCOUNTER — Encounter: Admit: 2017-04-23 | Discharge: 2017-04-23 | Payer: 59

## 2017-04-24 ENCOUNTER — Encounter: Admit: 2017-04-24 | Discharge: 2017-04-24 | Payer: 59

## 2017-04-25 ENCOUNTER — Encounter: Admit: 2017-04-25 | Discharge: 2017-04-25 | Payer: 59

## 2017-04-25 ENCOUNTER — Ambulatory Visit: Admit: 2017-04-25 | Discharge: 2017-04-25 | Payer: 59

## 2017-04-25 DIAGNOSIS — C50811 Malignant neoplasm of overlapping sites of right female breast: Secondary | ICD-10-CM

## 2017-04-25 MED ORDER — SILVER SULFADIAZINE 1 % TP CREA
Freq: Three times a day (TID) | TOPICAL | 1 refills | 10.00000 days | Status: AC
Start: 2017-04-25 — End: 2018-01-29

## 2017-04-26 NOTE — Progress Notes
Weekly Management Progress Note    Date:  04/25/2017    Molly Randolph is a 46 y.o. female.     Vitals:  Vitals:    04/25/17 1734   Weight: 69.4 kg (153 lb)     Subjective   The encounter diagnosis was Malignant neoplasm of overlapping sites of right breast in female, estrogen receptor positive (HCC).  Staging: Cancer Staging  Malignant neoplasm of central portion of left breast in female, estrogen receptor negative (HCC)  Staging form: Breast, AJCC 8th Edition  - Clinical stage from 09/18/2016: Stage IA (cT1c, cN0, cM0, G3, ER: Negative, PR: Negative, HER2: Positive) - Signed by Judye Bos, PA-C on 09/18/2016  - Pathologic stage from 10/12/2016: Stage IIA (pT1c, pN1a, cM0, G3, ER: Negative, PR: Negative, HER2: Positive) - Signed by Judye Bos, PA-C on 10/12/2016    Malignant neoplasm of overlapping sites of right female breast [facility])  Staging form: Breast, AJCC 7th Edition  - Clinical: Stage IIA (T2, N0, cM0) - Signed by Gershon Mussel, APRN on 07/15/2014  - Pathologic: Stage IIA (T2, N0, cM0) - Signed by Dimas Alexandria, PA-C on 08/12/2014  - Pathologic: No stage assigned - Unsigned      Treatment Data Summary:      Treatment Data 04/16/2017 04/16/2017 04/18/2017 04/19/2017 04/23/2017 04/24/2017 04/25/2017   Course ID C1 LT SCV/AX/CW - C1 LT SCV/AX/CW C1 LT SCV/AX/CW C1 LT SCV/AX/CW C1 LT SCV/AX/CW C1 LT SCV/AX/CW   Plan ID LT SCV -Fi LT CW-FiF LT CW BV LT CW BV LT CW BV LT CW BV LT CW BV   Prescription Dose (cGy) 4,005 2,670 1,000 1,000 1,000 1,000 1,000   Prescribed Dose per Fraction (Gy) 2.67 2.67 2 2 2 2 2    Fractions Treated to Date 15 10 1 2 3 4 5    Total Fractions on Plan 15 10 5 5 5 5 5    Treatment Elapsed Days 29 - 31 32 36 37 38   Reference Point ID LT SCV LT CW CW BOOST CW BOOST CW BOOST CW BOOST CW BOOST   Dosage Given to Date (Gy) 40.05 40.05 2 4 6 8 10        Subjective:  Patient describes increased pain today, 8/10 over chest wall. Has been using mometasone.  Pain worst in axilla and inframammary fold.     Objective: Vitals as above  GENERAL: Patient pleasant, no apparent distress.  SKIN: There is mild hyperpigmentation over the treated chest wall. Moist desquamation in the axilla.     Assessment: 46 year old female with left breast cancer    Plan: EOT today.  Mepilex dressing applied.  Encouraged use of topical lidocaine prn.  Continue skin care.  Follow-up in one month. She was encouraged to call with questions or concerns in the interim.

## 2017-05-01 ENCOUNTER — Ambulatory Visit: Admit: 2017-04-16 | Discharge: 2017-05-01 | Payer: 59

## 2017-05-01 ENCOUNTER — Ambulatory Visit: Admit: 2017-04-16 | Discharge: 2017-04-16 | Payer: 59

## 2017-05-01 DIAGNOSIS — Z170 Estrogen receptor positive status [ER+]: ICD-10-CM

## 2017-05-01 DIAGNOSIS — C50812 Malignant neoplasm of overlapping sites of left female breast: ICD-10-CM

## 2017-05-01 DIAGNOSIS — Z006 Encounter for examination for normal comparison and control in clinical research program: ICD-10-CM

## 2017-05-01 DIAGNOSIS — C50112 Malignant neoplasm of central portion of left female breast: Principal | ICD-10-CM

## 2017-05-01 DIAGNOSIS — C773 Secondary and unspecified malignant neoplasm of axilla and upper limb lymph nodes: ICD-10-CM

## 2017-05-01 DIAGNOSIS — Z171 Estrogen receptor negative status [ER-]: ICD-10-CM

## 2017-05-01 DIAGNOSIS — C50811 Malignant neoplasm of overlapping sites of right female breast: Secondary | ICD-10-CM

## 2017-05-05 NOTE — Progress Notes
End of Treatment Summary Note    Date:  04/25/2017    Molly Randolph is a 46 y.o. female.       Staging: Cancer Staging  Malignant neoplasm of central portion of left breast in female, estrogen receptor negative (HCC)  Staging form: Breast, AJCC 8th Edition  - Clinical stage from 09/18/2016: Stage IA (cT1c, cN0, cM0, G3, ER: Negative, PR: Negative, HER2: Positive) - Signed by Judye Bos, PA-C on 09/18/2016  - Pathologic stage from 10/12/2016: Stage IIA (pT1c, pN1a, cM0, G3, ER: Negative, PR: Negative, HER2: Positive) - Signed by Judye Bos, PA-C on 10/12/2016    Malignant neoplasm of overlapping sites of right female breast [facility])  Staging form: Breast, AJCC 7th Edition  - Clinical: Stage IIA (T2, N0, cM0) - Signed by Gershon Mussel, APRN on 07/15/2014  - Pathologic: Stage IIA (T2, N0, cM0) - Signed by Dimas Alexandria, PA-C on 08/12/2014  - Pathologic: No stage assigned - Unsigned      Treatment Data Summary:        The patient was treated with 3D conformal radiation as detailed in the above table.     46 y.o. 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 She is status post  left skin sparing mastectomy and sentinel lymph node biopsy.  Final pathology showed micrometastases 0.9 mm in size in 1 of 2 excised lymph nodes.  The primary was grade 3 IDC, multifocal with the largest focus measuring 1.7 cm.  Margins were greater than 1 cm, however there was LVSI present. 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  For her most recent left breast cancer, she underwent adjuvant radiation as described above on our hypofractionation IIT trial.    Treatment Tolerance and Response:  The patient tolerated treatment well without unexpected or significant acute side effects. She had numerous delays in treatment due to social issues.  Nine days total missed throughout her treatment course.         Skin: grade 1    Follow Up:  Molly Randolph will be scheduled for a follow up with our clinic in one month. She was encouraged to call with questions or concerns in the interim.

## 2017-05-07 ENCOUNTER — Encounter: Admit: 2017-05-07 | Discharge: 2017-05-07 | Payer: 59

## 2017-05-07 MED ORDER — TRASTUZUMAB IVPB
6 mg/kg | Freq: Once | INTRAVENOUS | 0 refills | Status: CN
Start: 2017-05-07 — End: ?

## 2017-05-17 ENCOUNTER — Ambulatory Visit: Admit: 2017-05-17 | Discharge: 2017-05-31 | Payer: 59

## 2017-05-20 ENCOUNTER — Encounter: Admit: 2017-05-20 | Discharge: 2017-05-20 | Payer: 59

## 2017-05-20 MED ORDER — CARISOPRODOL 350 MG PO TAB
350 mg | ORAL_TABLET | Freq: Three times a day (TID) | ORAL | 0 refills | Status: AC
Start: 2017-05-20 — End: 2017-08-12

## 2017-05-21 ENCOUNTER — Encounter: Admit: 2017-05-21 | Discharge: 2017-05-21 | Payer: 59

## 2017-05-22 ENCOUNTER — Encounter: Admit: 2017-05-22 | Discharge: 2017-05-22 | Payer: 59

## 2017-05-22 MED ORDER — CARISOPRODOL 350 MG PO TAB
350 mg | ORAL_TABLET | Freq: Three times a day (TID) | ORAL | 0 refills
Start: 2017-05-22 — End: ?

## 2017-05-22 NOTE — Progress Notes
She will stop by [facility] to pick up the soma  prescription tomorrow, on level 2.

## 2017-06-07 ENCOUNTER — Encounter: Admit: 2017-06-07 | Discharge: 2017-06-07 | Payer: 59

## 2017-06-14 ENCOUNTER — Encounter: Admit: 2017-06-14 | Discharge: 2017-06-14 | Payer: 59

## 2017-06-14 DIAGNOSIS — C50811 Malignant neoplasm of overlapping sites of right female breast: ICD-10-CM

## 2017-06-14 DIAGNOSIS — Z170 Estrogen receptor positive status [ER+]: ICD-10-CM

## 2017-06-14 DIAGNOSIS — C50812 Malignant neoplasm of overlapping sites of left female breast: ICD-10-CM

## 2017-06-14 DIAGNOSIS — Z171 Estrogen receptor negative status [ER-]: ICD-10-CM

## 2017-06-14 DIAGNOSIS — C773 Secondary and unspecified malignant neoplasm of axilla and upper limb lymph nodes: ICD-10-CM

## 2017-06-14 DIAGNOSIS — Z5111 Encounter for antineoplastic chemotherapy: Principal | ICD-10-CM

## 2017-06-14 MED ORDER — TRASTUZUMAB IVPB
6 mg/kg | Freq: Once | INTRAVENOUS | 0 refills | Status: CP
Start: 2017-06-14 — End: ?
  Administered 2017-06-14 (×2): 408.7 mg via INTRAVENOUS

## 2017-06-14 MED ORDER — HEPARIN, PORCINE (PF) 100 UNIT/ML IV SYRG
500 [IU] | Freq: Once | INTRAVENOUS | 0 refills | Status: CP
Start: 2017-06-14 — End: ?
  Administered 2017-06-14: 15:00:00 500 [IU] via INTRAVENOUS

## 2017-06-14 NOTE — Progress Notes
CHEMO NOTE  Verified chemo consent signed and in chart. Yes    Verified initiate chemo order in O2 Yes    Blood return positive via: Port (Single)    BSA and dose double checked (agree with orders as written) with: yes     Labs/applicable tests checked: None 2-D ECHOCARDIOGRAM, EF-55% done on 03/08/17    Chemo regime: Drug/cycle/day trastuzumab (HERCEPTIN) 408.7 mg in sodium chloride 0.9% (NS) 010.9323 mL IVPB-D1C10    Rate verified and armband double checkwith second RN: yes    Patient education offered and stated understanding. Denies questions at this time.Yes    Pt arrived treatment area ambulatory for D1C10 HERCEPTIN, Pt report fatigue 7/10, Pt states " I work 15 hrs every day, six days a week, that's why I'm tired," infusion complete without any complication, deaccess port per protocol, Pt dismissed ambulatory A&O at 0921.

## 2017-06-20 ENCOUNTER — Encounter: Admit: 2017-06-20 | Discharge: 2017-06-20 | Payer: 59

## 2017-06-20 NOTE — Progress Notes
MEDICATION ASSISTANCE PROGRAM (MAP)  MANUFACTURER CO-PAY ASSISTANCE    Drug: NEULASTA  Manufacturer Program: AMGEN FIRST STEP  Diagnosis Code:   Status: ENROLLED  Enrollment Period: 07/02/17 - 07/01/18   Co-pay: $5  Annual Benefit: $10,000    Notes: Please treat and bill as usual and MAP will manage processing credits to the patient's accounts. This program does not support ancillary charges like chair time, administration fees, lab fees, scans, etc.     Egbert Garibaldi  Medication Assistance Coordinator  [email or web address]  [phone]

## 2017-06-28 ENCOUNTER — Encounter: Admit: 2017-06-28 | Discharge: 2017-06-28 | Payer: 59

## 2017-06-28 NOTE — Progress Notes
I have confirmed with patient of her treatments (Heceptin) to stay on every 3 weeks and imperative to have an echo done prior to next treatment. She asked to move all of her appointments to very late of the day due to work schedule. Echo date and time confirmed. AVS is mailing to patient home address today. Encouraged patient to call our office with any questions. She voiced understanding.

## 2017-07-08 NOTE — Progress Notes
Study Title: Phase II Study of Hypofractionated radiation therapy for patients with breast cancer receiving regional nodal irradiation   HSC: #[id number]  Consent Version Date: Approval Period 04/01/2016 ??? 04/01/2017  ???  Clinical trial participation and research nature of the trial were discussed with subject during this visit. Subject was alert and oriented during consent discussion. Subject was informed that clinical trial is voluntary and she may withdraw consent at any time for any reason by notifying study team. Study purpose, procedures, tests, samples to be obtained, potential side effects, benefits, possible risks and duration of study were discussed. HIPAA information, and insurance pre-certification were discussed per consent form.  Alternative courses of treatment were also discussed per consent. Subject verbalized understanding.    ???  Subject was given time to review the consent form and to discuss participation in this study. Questions asked were answered to her satisfaction and subject voiced desire to sign consent. Subject signed consent without coercion and undue influence. A copy of the signed consent was given to the subject and scanned to subject???s medical record. Contact information for the study team was given to the subject.   ???  No research specific procedures took place prior to consenting.  ???  Risk of pregnancy and birth defects were discussed in detail. Protocol specific methods of contraception and duration were discussed. Subject agreed to use of protocol specific methods of contraception and duration.  ???  Subject was informed study participation can be terminated due to disease progression, subject withdrawal, unanticipated toxicity, investigator discretion, sponsor or FDA.   ???  Subject was informed that new findings involving potential risk will be discussed for continuous consenting to trial.   ???  Patient completed BREAST Q and FACT-B Questionnaires per study protocol.     Molly Randolph [phone]

## 2017-07-09 ENCOUNTER — Encounter: Admit: 2017-07-09 | Discharge: 2017-07-09 | Payer: 59

## 2017-07-10 ENCOUNTER — Encounter: Admit: 2017-07-10 | Discharge: 2017-07-10 | Payer: 59

## 2017-07-11 ENCOUNTER — Encounter: Admit: 2017-07-11 | Discharge: 2017-07-11 | Payer: 59

## 2017-07-19 ENCOUNTER — Encounter: Admit: 2017-07-19 | Discharge: 2017-07-19 | Payer: 59

## 2017-07-26 ENCOUNTER — Encounter: Admit: 2017-07-26 | Discharge: 2017-07-26 | Payer: 59

## 2017-07-30 ENCOUNTER — Encounter: Admit: 2017-07-30 | Discharge: 2017-07-30 | Payer: 59

## 2017-07-30 DIAGNOSIS — Z79899 Other long term (current) drug therapy: Principal | ICD-10-CM

## 2017-08-12 ENCOUNTER — Encounter: Admit: 2017-08-12 | Discharge: 2017-08-12 | Payer: 59

## 2017-08-12 ENCOUNTER — Encounter: Admit: 2017-08-12 | Discharge: 2017-08-25 | Payer: 59

## 2017-08-12 DIAGNOSIS — C50912 Malignant neoplasm of unspecified site of left female breast: ICD-10-CM

## 2017-08-12 DIAGNOSIS — Z171 Estrogen receptor negative status [ER-]: ICD-10-CM

## 2017-08-12 DIAGNOSIS — M1990 Unspecified osteoarthritis, unspecified site: ICD-10-CM

## 2017-08-12 DIAGNOSIS — Z170 Estrogen receptor positive status [ER+]: ICD-10-CM

## 2017-08-12 DIAGNOSIS — C50812 Malignant neoplasm of overlapping sites of left female breast: ICD-10-CM

## 2017-08-12 DIAGNOSIS — C50112 Malignant neoplasm of central portion of left female breast: ICD-10-CM

## 2017-08-12 DIAGNOSIS — C773 Secondary and unspecified malignant neoplasm of axilla and upper limb lymph nodes: ICD-10-CM

## 2017-08-12 DIAGNOSIS — M797 Fibromyalgia: Principal | ICD-10-CM

## 2017-08-12 DIAGNOSIS — C50911 Malignant neoplasm of unspecified site of right female breast: ICD-10-CM

## 2017-08-12 DIAGNOSIS — Z789 Other specified health status: ICD-10-CM

## 2017-08-12 DIAGNOSIS — Z79899 Other long term (current) drug therapy: ICD-10-CM

## 2017-08-12 DIAGNOSIS — F329 Major depressive disorder, single episode, unspecified: ICD-10-CM

## 2017-08-12 DIAGNOSIS — C50811 Malignant neoplasm of overlapping sites of right female breast: Principal | ICD-10-CM

## 2017-08-12 DIAGNOSIS — C50919 Malignant neoplasm of unspecified site of unspecified female breast: ICD-10-CM

## 2017-08-12 DIAGNOSIS — F4310 Post-traumatic stress disorder, unspecified: ICD-10-CM

## 2017-08-12 MED ORDER — TRASTUZUMAB IVPB
6 mg/kg | Freq: Once | INTRAVENOUS | 0 refills | Status: CP
Start: 2017-08-12 — End: ?
  Administered 2017-08-12 (×2): 408.7 mg via INTRAVENOUS

## 2017-08-12 MED ORDER — RP DX TC-99M RBC MCI
30 | Freq: Once | INTRAVENOUS | 0 refills | Status: CP
Start: 2017-08-12 — End: ?
  Administered 2017-08-12: 16:00:00 31.5 via INTRAVENOUS

## 2017-08-12 MED ORDER — HEPARIN, PORCINE (PF) 100 UNIT/ML IV SYRG
500 [IU] | Freq: Once | INTRAVENOUS | 0 refills | Status: AC
Start: 2017-08-12 — End: ?

## 2017-08-12 MED ORDER — HEPARIN, PORCINE (PF) 100 UNIT/ML IV SYRG
500 [IU] | Freq: Once | INTRAVENOUS | 0 refills | Status: CP
Start: 2017-08-12 — End: ?
  Administered 2017-08-12: 19:00:00 500 [IU] via INTRAVENOUS

## 2017-08-12 MED ORDER — CARISOPRODOL 350 MG PO TAB
350 mg | ORAL_TABLET | Freq: Three times a day (TID) | ORAL | 0 refills | Status: AC
Start: 2017-08-12 — End: ?

## 2017-08-13 ENCOUNTER — Encounter: Admit: 2017-08-13 | Discharge: 2017-08-13 | Payer: 59

## 2017-08-13 DIAGNOSIS — F4310 Post-traumatic stress disorder, unspecified: ICD-10-CM

## 2017-08-13 DIAGNOSIS — M1990 Unspecified osteoarthritis, unspecified site: ICD-10-CM

## 2017-08-13 DIAGNOSIS — F329 Major depressive disorder, single episode, unspecified: ICD-10-CM

## 2017-08-13 DIAGNOSIS — Z789 Other specified health status: ICD-10-CM

## 2017-08-13 DIAGNOSIS — M797 Fibromyalgia: Principal | ICD-10-CM

## 2017-08-13 DIAGNOSIS — C50919 Malignant neoplasm of unspecified site of unspecified female breast: ICD-10-CM

## 2017-08-14 ENCOUNTER — Encounter: Admit: 2017-08-14 | Discharge: 2017-08-14 | Payer: 59

## 2017-08-14 MED ORDER — TRASTUZUMAB IVPB
6 mg/kg | Freq: Once | INTRAVENOUS | 0 refills | Status: CN
Start: 2017-08-14 — End: ?

## 2017-08-25 DIAGNOSIS — Z79899 Other long term (current) drug therapy: Principal | ICD-10-CM

## 2017-10-03 ENCOUNTER — Encounter: Admit: 2017-10-03 | Discharge: 2017-10-03 | Payer: 59

## 2017-10-03 DIAGNOSIS — M797 Fibromyalgia: Principal | ICD-10-CM

## 2017-10-03 DIAGNOSIS — F329 Major depressive disorder, single episode, unspecified: ICD-10-CM

## 2017-10-03 DIAGNOSIS — M1990 Unspecified osteoarthritis, unspecified site: ICD-10-CM

## 2017-10-03 DIAGNOSIS — F4310 Post-traumatic stress disorder, unspecified: ICD-10-CM

## 2017-10-03 DIAGNOSIS — C50919 Malignant neoplasm of unspecified site of unspecified female breast: ICD-10-CM

## 2017-10-03 DIAGNOSIS — Z789 Other specified health status: ICD-10-CM

## 2017-10-21 ENCOUNTER — Encounter: Admit: 2017-10-21 | Discharge: 2017-10-21 | Payer: 59

## 2017-11-11 ENCOUNTER — Encounter: Admit: 2017-11-11 | Discharge: 2017-11-11 | Payer: 59

## 2018-01-20 DIAGNOSIS — K5792 Diverticulitis of intestine, part unspecified, without perforation or abscess without bleeding: Principal | ICD-10-CM

## 2018-01-20 MED ORDER — PEG-ELECTROLYTE SOLN 420 GRAM PO SOLR
0 refills | Status: AC
Start: 2018-01-20 — End: 2018-07-10

## 2018-01-29 ENCOUNTER — Encounter: Admit: 2018-01-29 | Discharge: 2018-01-29 | Payer: 59

## 2018-01-29 DIAGNOSIS — F4310 Post-traumatic stress disorder, unspecified: ICD-10-CM

## 2018-01-29 DIAGNOSIS — F329 Major depressive disorder, single episode, unspecified: ICD-10-CM

## 2018-01-29 DIAGNOSIS — K5792 Diverticulitis of intestine, part unspecified, without perforation or abscess without bleeding: ICD-10-CM

## 2018-01-29 DIAGNOSIS — M797 Fibromyalgia: Principal | ICD-10-CM

## 2018-01-29 DIAGNOSIS — M1990 Unspecified osteoarthritis, unspecified site: ICD-10-CM

## 2018-01-29 DIAGNOSIS — T4145XA Adverse effect of unspecified anesthetic, initial encounter: ICD-10-CM

## 2018-01-29 DIAGNOSIS — K635 Polyp of colon: ICD-10-CM

## 2018-01-29 DIAGNOSIS — C50919 Malignant neoplasm of unspecified site of unspecified female breast: ICD-10-CM

## 2018-01-29 DIAGNOSIS — K620 Anal polyp: ICD-10-CM

## 2018-01-29 DIAGNOSIS — Z789 Other specified health status: ICD-10-CM

## 2018-01-31 ENCOUNTER — Encounter: Admit: 2018-01-31 | Discharge: 2018-01-31 | Payer: 59

## 2018-01-31 ENCOUNTER — Encounter: Admit: 2018-01-31 | Discharge: 2018-02-01 | Payer: 59

## 2018-01-31 ENCOUNTER — Ambulatory Visit: Admit: 2018-01-31 | Discharge: 2018-01-31 | Payer: 59

## 2018-01-31 DIAGNOSIS — K5792 Diverticulitis of intestine, part unspecified, without perforation or abscess without bleeding: Principal | ICD-10-CM

## 2018-01-31 DIAGNOSIS — M1990 Unspecified osteoarthritis, unspecified site: ICD-10-CM

## 2018-01-31 DIAGNOSIS — M797 Fibromyalgia: Principal | ICD-10-CM

## 2018-01-31 DIAGNOSIS — Z800 Family history of malignant neoplasm of digestive organs: ICD-10-CM

## 2018-01-31 DIAGNOSIS — Z789 Other specified health status: ICD-10-CM

## 2018-01-31 DIAGNOSIS — K635 Polyp of colon: ICD-10-CM

## 2018-01-31 DIAGNOSIS — F329 Major depressive disorder, single episode, unspecified: ICD-10-CM

## 2018-01-31 DIAGNOSIS — F4310 Post-traumatic stress disorder, unspecified: ICD-10-CM

## 2018-01-31 DIAGNOSIS — T4145XA Adverse effect of unspecified anesthetic, initial encounter: ICD-10-CM

## 2018-01-31 DIAGNOSIS — K620 Anal polyp: ICD-10-CM

## 2018-01-31 DIAGNOSIS — C50919 Malignant neoplasm of unspecified site of unspecified female breast: ICD-10-CM

## 2018-01-31 MED ORDER — PROPOFOL 10 MG/ML IV EMUL 50 ML (INFUSION)(AM)(OR)
INTRAVENOUS | 0 refills | Status: DC
Start: 2018-01-31 — End: 2018-01-31

## 2018-01-31 MED ORDER — FENTANYL CITRATE (PF) 50 MCG/ML IJ SOLN
50 ug | INTRAVENOUS | 0 refills | Status: DC | PRN
Start: 2018-01-31 — End: 2018-07-10

## 2018-01-31 MED ORDER — LIDOCAINE (PF) 10 MG/ML (1 %) IJ SOLN
.1-2 mL | INTRAMUSCULAR | 0 refills | Status: DC | PRN
Start: 2018-01-31 — End: 2018-07-10

## 2018-01-31 MED ORDER — PROPOFOL INJ 10 MG/ML IV VIAL
0 refills | Status: DC
Start: 2018-01-31 — End: 2018-01-31

## 2018-01-31 MED ORDER — ONDANSETRON HCL (PF) 4 MG/2 ML IJ SOLN
4 mg | Freq: Once | INTRAVENOUS | 0 refills | Status: AC | PRN
Start: 2018-01-31 — End: ?

## 2018-01-31 MED ORDER — HEPARIN, PORCINE (PF) 100 UNIT/ML IV SYRG
500 [IU] | Freq: Once | 0 refills | Status: CP
Start: 2018-01-31 — End: ?

## 2018-01-31 MED ORDER — LACTATED RINGERS IV SOLP
1000 mL | INTRAVENOUS | 0 refills | Status: AC
Start: 2018-01-31 — End: ?

## 2018-01-31 MED ORDER — FENTANYL CITRATE (PF) 50 MCG/ML IJ SOLN
50 ug | Freq: Once | INTRAVENOUS | 0 refills | Status: CP
Start: 2018-01-31 — End: ?

## 2018-01-31 MED ORDER — LACTATED RINGERS IV SOLP
0 refills | Status: DC
Start: 2018-01-31 — End: 2018-01-31

## 2018-01-31 MED ORDER — SIMETHICONE 40 MG/0.6 ML PO DRPS
0 refills | Status: DC
Start: 2018-01-31 — End: 2018-02-05

## 2018-01-31 MED ORDER — IBUPROFEN 200 MG PO TAB
600 mg | Freq: Once | ORAL | 0 refills | Status: CP
Start: 2018-01-31 — End: ?

## 2018-02-03 ENCOUNTER — Encounter: Admit: 2018-02-03 | Discharge: 2018-02-03 | Payer: 59

## 2018-02-03 DIAGNOSIS — T4145XA Adverse effect of unspecified anesthetic, initial encounter: ICD-10-CM

## 2018-02-03 DIAGNOSIS — K5792 Diverticulitis of intestine, part unspecified, without perforation or abscess without bleeding: ICD-10-CM

## 2018-02-03 DIAGNOSIS — K620 Anal polyp: ICD-10-CM

## 2018-02-03 DIAGNOSIS — C50919 Malignant neoplasm of unspecified site of unspecified female breast: ICD-10-CM

## 2018-02-03 DIAGNOSIS — F329 Major depressive disorder, single episode, unspecified: ICD-10-CM

## 2018-02-03 DIAGNOSIS — Z789 Other specified health status: ICD-10-CM

## 2018-02-03 DIAGNOSIS — M797 Fibromyalgia: Principal | ICD-10-CM

## 2018-02-03 DIAGNOSIS — M1990 Unspecified osteoarthritis, unspecified site: ICD-10-CM

## 2018-02-03 DIAGNOSIS — K635 Polyp of colon: ICD-10-CM

## 2018-02-03 DIAGNOSIS — F4310 Post-traumatic stress disorder, unspecified: ICD-10-CM

## 2018-02-05 ENCOUNTER — Encounter: Admit: 2018-02-05 | Discharge: 2018-02-05 | Payer: 59

## 2018-03-11 ENCOUNTER — Encounter: Admit: 2018-03-11 | Discharge: 2018-03-11 | Payer: 59

## 2018-03-11 DIAGNOSIS — R911 Solitary pulmonary nodule: Principal | ICD-10-CM

## 2018-07-04 ENCOUNTER — Encounter: Admit: 2018-07-04 | Discharge: 2018-07-04 | Payer: 59

## 2018-07-04 ENCOUNTER — Emergency Department: Admit: 2018-07-04 | Discharge: 2018-07-05 | Disposition: A | Discharge status: Home / Self Care | Payer: 59

## 2018-07-04 DIAGNOSIS — F329 Major depressive disorder, single episode, unspecified: Secondary | ICD-10-CM

## 2018-07-04 DIAGNOSIS — C50919 Malignant neoplasm of unspecified site of unspecified female breast: Secondary | ICD-10-CM

## 2018-07-04 DIAGNOSIS — K635 Polyp of colon: Secondary | ICD-10-CM

## 2018-07-04 DIAGNOSIS — K5792 Diverticulitis of intestine, part unspecified, without perforation or abscess without bleeding: Secondary | ICD-10-CM

## 2018-07-04 DIAGNOSIS — T4145XA Adverse effect of unspecified anesthetic, initial encounter: Secondary | ICD-10-CM

## 2018-07-04 DIAGNOSIS — M797 Fibromyalgia: Secondary | ICD-10-CM

## 2018-07-04 DIAGNOSIS — M1990 Unspecified osteoarthritis, unspecified site: Secondary | ICD-10-CM

## 2018-07-04 DIAGNOSIS — Z789 Other specified health status: Secondary | ICD-10-CM

## 2018-07-04 DIAGNOSIS — F4310 Post-traumatic stress disorder, unspecified: Secondary | ICD-10-CM

## 2018-07-04 DIAGNOSIS — K620 Anal polyp: Secondary | ICD-10-CM

## 2018-07-04 LAB — SED RATE: Lab: 27 mm/h — ABNORMAL HIGH (ref >60)

## 2018-07-04 LAB — C REACTIVE PROTEIN (CRP): Lab: 0.3 mg/dL (ref <1.0)

## 2018-07-04 LAB — POC LACTATE: Lab: 1.5 MMOL/L (ref 0.5–2.0)

## 2018-07-04 LAB — COMPREHENSIVE METABOLIC PANEL
Lab: 0.2 mg/dL — ABNORMAL LOW (ref 0.3–1.2)
Lab: 103 MMOL/L — ABNORMAL LOW (ref 98–110)
Lab: 142 MMOL/L — ABNORMAL LOW (ref 137–147)
Lab: 15 mg/dL (ref 7–25)
Lab: 19 U/L (ref 7–56)
Lab: 3.6 MMOL/L — ABNORMAL HIGH (ref 3.5–5.1)
Lab: 30 MMOL/L (ref 21–30)
Lab: 58 U/L (ref 25–110)
Lab: 85 mg/dL (ref 70–100)
Lab: 9.1 mg/dL — ABNORMAL HIGH (ref 8.5–10.6)

## 2018-07-04 LAB — CBC AND DIFF: Lab: 6.5 K/UL (ref 4.5–11.0)

## 2018-07-04 LAB — LACTIC ACID (BG - RAPID LACTATE): Lab: 2 MMOL/L (ref 0.5–2.0)

## 2018-07-04 MED ORDER — ACETAMINOPHEN 325 MG PO TAB
650 mg | Freq: Once | ORAL | 0 refills | Status: DC
Start: 2018-07-04 — End: 2018-07-05

## 2018-07-04 MED ORDER — DOXYCYCLINE HYCLATE 100 MG PO TAB
100 mg | ORAL_TABLET | Freq: Two times a day (BID) | ORAL | 0 refills | 8.00000 days | Status: AC
Start: 2018-07-04 — End: ?

## 2018-07-04 MED ORDER — FENTANYL CITRATE (PF) 50 MCG/ML IJ SOLN
50 ug | Freq: Once | INTRAVENOUS | 0 refills | Status: CP
Start: 2018-07-04 — End: ?
  Administered 2018-07-04: 50 ug via INTRAVENOUS

## 2018-07-04 MED ORDER — FENTANYL CITRATE (PF) 50 MCG/ML IJ SOLN
50 ug | Freq: Once | INTRAVENOUS | 0 refills | Status: CP
Start: 2018-07-04 — End: ?
  Administered 2018-07-05: 02:00:00 50 ug via INTRAVENOUS

## 2018-07-04 MED ORDER — FENTANYL CITRATE (PF) 50 MCG/ML IJ SOLN
100 ug | Freq: Once | INTRAVENOUS | 0 refills | Status: DC
Start: 2018-07-04 — End: 2018-07-04

## 2018-07-04 MED ORDER — DIAZEPAM 5 MG PO TAB
2.5 mg | ORAL_TABLET | ORAL | 0 refills | 7.00000 days | Status: AC | PRN
Start: 2018-07-04 — End: ?

## 2018-07-05 DIAGNOSIS — T8579XA Infection and inflammatory reaction due to other internal prosthetic devices, implants and grafts, initial encounter: Secondary | ICD-10-CM

## 2018-07-07 ENCOUNTER — Encounter: Admit: 2018-07-07 | Discharge: 2018-07-07 | Payer: 59

## 2018-07-07 MED ORDER — DIAZEPAM 5 MG PO TAB
5 mg | ORAL_TABLET | ORAL | 0 refills | 7.00000 days | Status: AC | PRN
Start: 2018-07-07 — End: 2018-07-10

## 2018-07-10 ENCOUNTER — Ambulatory Visit: Admit: 2018-07-10 | Discharge: 2018-07-11 | Payer: 59

## 2018-07-10 ENCOUNTER — Encounter: Admit: 2018-07-10 | Discharge: 2018-07-10 | Payer: 59

## 2018-07-10 ENCOUNTER — Ambulatory Visit: Admit: 2018-07-16 | Discharge: 2018-07-16 | Payer: 59

## 2018-07-10 DIAGNOSIS — F329 Major depressive disorder, single episode, unspecified: Secondary | ICD-10-CM

## 2018-07-10 DIAGNOSIS — M797 Fibromyalgia: Secondary | ICD-10-CM

## 2018-07-10 DIAGNOSIS — K5792 Diverticulitis of intestine, part unspecified, without perforation or abscess without bleeding: Secondary | ICD-10-CM

## 2018-07-10 DIAGNOSIS — T4145XA Adverse effect of unspecified anesthetic, initial encounter: Secondary | ICD-10-CM

## 2018-07-10 DIAGNOSIS — F4310 Post-traumatic stress disorder, unspecified: Secondary | ICD-10-CM

## 2018-07-10 DIAGNOSIS — K620 Anal polyp: Secondary | ICD-10-CM

## 2018-07-10 DIAGNOSIS — C50919 Malignant neoplasm of unspecified site of unspecified female breast: Secondary | ICD-10-CM

## 2018-07-10 DIAGNOSIS — Z789 Other specified health status: Secondary | ICD-10-CM

## 2018-07-10 DIAGNOSIS — Z9882 Breast implant status: Secondary | ICD-10-CM

## 2018-07-10 DIAGNOSIS — M1990 Unspecified osteoarthritis, unspecified site: Secondary | ICD-10-CM

## 2018-07-10 DIAGNOSIS — C50112 Malignant neoplasm of central portion of left female breast: Secondary | ICD-10-CM

## 2018-07-10 DIAGNOSIS — K635 Polyp of colon: Secondary | ICD-10-CM

## 2018-07-10 DIAGNOSIS — N651 Disproportion of reconstructed breast: Secondary | ICD-10-CM

## 2018-07-10 LAB — CULTURE-BLOOD W/SENSITIVITY

## 2018-07-16 ENCOUNTER — Encounter: Admit: 2018-07-16 | Discharge: 2018-07-16 | Payer: 59

## 2018-07-16 DIAGNOSIS — Z789 Other specified health status: Secondary | ICD-10-CM

## 2018-07-16 DIAGNOSIS — K5792 Diverticulitis of intestine, part unspecified, without perforation or abscess without bleeding: Secondary | ICD-10-CM

## 2018-07-16 DIAGNOSIS — F4310 Post-traumatic stress disorder, unspecified: Secondary | ICD-10-CM

## 2018-07-16 DIAGNOSIS — F329 Major depressive disorder, single episode, unspecified: Secondary | ICD-10-CM

## 2018-07-16 DIAGNOSIS — M1990 Unspecified osteoarthritis, unspecified site: Secondary | ICD-10-CM

## 2018-07-16 DIAGNOSIS — T4145XA Adverse effect of unspecified anesthetic, initial encounter: Secondary | ICD-10-CM

## 2018-07-16 DIAGNOSIS — K620 Anal polyp: Secondary | ICD-10-CM

## 2018-07-16 DIAGNOSIS — M797 Fibromyalgia: Secondary | ICD-10-CM

## 2018-07-16 DIAGNOSIS — K635 Polyp of colon: Secondary | ICD-10-CM

## 2018-07-16 DIAGNOSIS — C50919 Malignant neoplasm of unspecified site of unspecified female breast: Secondary | ICD-10-CM

## 2018-07-16 MED ORDER — SENNOSIDES 8.6 MG PO TAB
1 | ORAL_TABLET | Freq: Two times a day (BID) | ORAL | 1 refills | Status: AC
Start: 2018-07-16 — End: 2018-08-13

## 2018-07-16 MED ORDER — DEXTRAN 70-HYPROMELLOSE (PF) 0.1-0.3 % OP DPET
0 refills | Status: DC
Start: 2018-07-16 — End: 2018-07-16
  Administered 2018-07-16: 22:00:00 2 [drp] via OPHTHALMIC

## 2018-07-16 MED ORDER — HALOPERIDOL LACTATE 5 MG/ML IJ SOLN
1 mg | Freq: Once | INTRAVENOUS | 0 refills | Status: DC | PRN
Start: 2018-07-16 — End: 2018-07-17

## 2018-07-16 MED ORDER — MIDAZOLAM 1 MG/ML IJ SOLN
INTRAVENOUS | 0 refills | Status: DC
Start: 2018-07-16 — End: 2018-07-16
  Administered 2018-07-16: 22:00:00 2 mg via INTRAVENOUS

## 2018-07-16 MED ORDER — LIDOCAINE (PF) 200 MG/10 ML (2 %) IJ SYRG
0 refills | Status: DC
Start: 2018-07-16 — End: 2018-07-16
  Administered 2018-07-16: 22:00:00 50 mg via INTRAVENOUS

## 2018-07-16 MED ORDER — LACTATED RINGERS IV SOLP
0 refills | Status: DC
Start: 2018-07-16 — End: 2018-07-16
  Administered 2018-07-16: 22:00:00 via INTRAVENOUS

## 2018-07-16 MED ORDER — LACTATED RINGERS IV SOLP
1000 mL | Freq: Once | INTRAVENOUS | 0 refills | Status: CP
Start: 2018-07-16 — End: ?
  Administered 2018-07-16: 21:00:00 1000 mL via INTRAVENOUS

## 2018-07-16 MED ORDER — DOXYCYCLINE HYCLATE 100 MG PO TAB
100 mg | ORAL_TABLET | Freq: Two times a day (BID) | ORAL | 0 refills | 8.00000 days | Status: AC
Start: 2018-07-16 — End: ?

## 2018-07-16 MED ORDER — ROCURONIUM 10 MG/ML IV SOLN
INTRAVENOUS | 0 refills | Status: DC
Start: 2018-07-16 — End: 2018-07-16
  Administered 2018-07-16: 22:00:00 40 mg via INTRAVENOUS
  Administered 2018-07-16: 22:00:00 10 mg via INTRAVENOUS

## 2018-07-16 MED ORDER — LIDOCAINE (PF) 10 MG/ML (1 %) IJ SOLN
.1-2 mL | INTRAMUSCULAR | 0 refills | Status: DC | PRN
Start: 2018-07-16 — End: 2018-07-17

## 2018-07-16 MED ORDER — FENTANYL CITRATE (PF) 50 MCG/ML IJ SOLN
25 ug | INTRAVENOUS | 0 refills | Status: DC | PRN
Start: 2018-07-16 — End: 2018-07-17
  Administered 2018-07-16 (×2): 25 ug via INTRAVENOUS

## 2018-07-16 MED ORDER — OXYCODONE 5 MG PO TAB
5 mg | Freq: Once | ORAL | 0 refills | Status: CP
Start: 2018-07-16 — End: ?
  Administered 2018-07-16: 23:00:00 5 mg via ORAL

## 2018-07-16 MED ORDER — DEXAMETHASONE SODIUM PHOSPHATE 4 MG/ML IJ SOLN
INTRAVENOUS | 0 refills | Status: DC
Start: 2018-07-16 — End: 2018-07-16
  Administered 2018-07-16: 22:00:00 4 mg via INTRAVENOUS

## 2018-07-16 MED ORDER — ACETAMINOPHEN 500 MG PO TAB
1000 mg | ORAL_TABLET | ORAL | 0 refills | Status: SS
Start: 2018-07-16 — End: 2018-09-30

## 2018-07-16 MED ORDER — HYDROMORPHONE (PF) 2 MG/ML IJ SYRG
0 refills | Status: DC
Start: 2018-07-16 — End: 2018-07-16
  Administered 2018-07-16: 22:00:00 .4 mg via INTRAVENOUS
  Administered 2018-07-16: 22:00:00 .6 mg via INTRAVENOUS

## 2018-07-16 MED ORDER — FENTANYL CITRATE (PF) 50 MCG/ML IJ SOLN
50 ug | INTRAVENOUS | 0 refills | Status: DC | PRN
Start: 2018-07-16 — End: 2018-07-17

## 2018-07-16 MED ORDER — FENTANYL CITRATE (PF) 50 MCG/ML IJ SOLN
0 refills | Status: DC
Start: 2018-07-16 — End: 2018-07-16
  Administered 2018-07-16 (×2): 50 ug via INTRAVENOUS
  Administered 2018-07-16: 22:00:00 100 ug via INTRAVENOUS

## 2018-07-16 MED ORDER — CEFAZOLIN 1 GRAM IJ SOLR
0 refills | Status: DC
Start: 2018-07-16 — End: 2018-07-16
  Administered 2018-07-16: 22:00:00 2 g via INTRAVENOUS

## 2018-07-16 MED ORDER — PROPOFOL INJ 10 MG/ML IV VIAL
0 refills | Status: DC
Start: 2018-07-16 — End: 2018-07-16
  Administered 2018-07-16: 22:00:00 200 mg via INTRAVENOUS

## 2018-07-16 MED ORDER — OXYCODONE 5 MG PO TAB
5-10 mg | Freq: Once | ORAL | 0 refills | Status: CP | PRN
Start: 2018-07-16 — End: ?
  Administered 2018-07-16: 23:00:00 5 mg via ORAL

## 2018-07-16 MED ORDER — ONDANSETRON HCL (PF) 4 MG/2 ML IJ SOLN
INTRAVENOUS | 0 refills | Status: DC
Start: 2018-07-16 — End: 2018-07-16
  Administered 2018-07-16: 23:00:00 4 mg via INTRAVENOUS

## 2018-07-16 MED ORDER — OXYCODONE 5 MG PO TAB
5 mg | ORAL_TABLET | ORAL | 0 refills | 6.00000 days | Status: AC | PRN
Start: 2018-07-16 — End: 2018-08-07

## 2018-07-16 MED ORDER — SUGAMMADEX 100 MG/ML IV SOLN
INTRAVENOUS | 0 refills | Status: DC
Start: 2018-07-16 — End: 2018-07-16
  Administered 2018-07-16: 23:00:00 150 mg via INTRAVENOUS

## 2018-07-16 MED ORDER — ACETAMINOPHEN 500 MG PO TAB
500 mg | Freq: Once | ORAL | 0 refills | Status: CP
Start: 2018-07-16 — End: ?
  Administered 2018-07-16: 23:00:00 500 mg via ORAL

## 2018-07-16 MED ORDER — HYDROMORPHONE (PF) 2 MG/ML IJ SYRG
.5-1 mg | INTRAVENOUS | 0 refills | Status: DC | PRN
Start: 2018-07-16 — End: 2018-07-17

## 2018-07-17 ENCOUNTER — Ambulatory Visit: Admit: 2018-07-16 | Discharge: 2018-07-17 | Payer: 59

## 2018-07-17 ENCOUNTER — Encounter: Admit: 2018-07-17 | Discharge: 2018-07-17 | Payer: 59

## 2018-07-17 ENCOUNTER — Ambulatory Visit: Admit: 2018-07-16 | Discharge: 2018-07-16 | Payer: 59

## 2018-07-17 DIAGNOSIS — Z9013 Acquired absence of bilateral breasts and nipples: Secondary | ICD-10-CM

## 2018-07-17 DIAGNOSIS — Z9882 Breast implant status: Secondary | ICD-10-CM

## 2018-07-17 DIAGNOSIS — M797 Fibromyalgia: Secondary | ICD-10-CM

## 2018-07-17 DIAGNOSIS — C50112 Malignant neoplasm of central portion of left female breast: Secondary | ICD-10-CM

## 2018-07-17 DIAGNOSIS — Z171 Estrogen receptor negative status [ER-]: Secondary | ICD-10-CM

## 2018-07-17 DIAGNOSIS — M1990 Unspecified osteoarthritis, unspecified site: Secondary | ICD-10-CM

## 2018-07-17 DIAGNOSIS — Z452 Encounter for adjustment and management of vascular access device: Secondary | ICD-10-CM

## 2018-07-17 DIAGNOSIS — F4310 Post-traumatic stress disorder, unspecified: Secondary | ICD-10-CM

## 2018-07-17 DIAGNOSIS — N651 Disproportion of reconstructed breast: Secondary | ICD-10-CM

## 2018-07-17 DIAGNOSIS — F17210 Nicotine dependence, cigarettes, uncomplicated: Secondary | ICD-10-CM

## 2018-07-18 ENCOUNTER — Encounter: Admit: 2018-07-18 | Discharge: 2018-07-18 | Payer: 59

## 2018-07-18 DIAGNOSIS — M1990 Unspecified osteoarthritis, unspecified site: Secondary | ICD-10-CM

## 2018-07-18 DIAGNOSIS — K635 Polyp of colon: Secondary | ICD-10-CM

## 2018-07-18 DIAGNOSIS — K5792 Diverticulitis of intestine, part unspecified, without perforation or abscess without bleeding: Secondary | ICD-10-CM

## 2018-07-18 DIAGNOSIS — T4145XA Adverse effect of unspecified anesthetic, initial encounter: Secondary | ICD-10-CM

## 2018-07-18 DIAGNOSIS — C50919 Malignant neoplasm of unspecified site of unspecified female breast: Secondary | ICD-10-CM

## 2018-07-18 DIAGNOSIS — K620 Anal polyp: Secondary | ICD-10-CM

## 2018-07-18 DIAGNOSIS — F4310 Post-traumatic stress disorder, unspecified: Secondary | ICD-10-CM

## 2018-07-18 DIAGNOSIS — M797 Fibromyalgia: Secondary | ICD-10-CM

## 2018-07-18 DIAGNOSIS — Z789 Other specified health status: Secondary | ICD-10-CM

## 2018-07-18 DIAGNOSIS — F329 Major depressive disorder, single episode, unspecified: Secondary | ICD-10-CM

## 2018-07-28 ENCOUNTER — Ambulatory Visit: Admit: 2018-07-28 | Discharge: 2018-07-29 | Payer: 59

## 2018-07-28 ENCOUNTER — Encounter: Admit: 2018-07-28 | Discharge: 2018-07-28 | Payer: 59

## 2018-07-28 DIAGNOSIS — C50811 Malignant neoplasm of overlapping sites of right female breast: Secondary | ICD-10-CM

## 2018-07-28 DIAGNOSIS — M797 Fibromyalgia: Secondary | ICD-10-CM

## 2018-07-28 DIAGNOSIS — N650 Deformity of reconstructed breast: Secondary | ICD-10-CM

## 2018-07-28 DIAGNOSIS — M1990 Unspecified osteoarthritis, unspecified site: Secondary | ICD-10-CM

## 2018-07-28 DIAGNOSIS — C50112 Malignant neoplasm of central portion of left female breast: Secondary | ICD-10-CM

## 2018-07-28 DIAGNOSIS — C50919 Malignant neoplasm of unspecified site of unspecified female breast: Secondary | ICD-10-CM

## 2018-07-28 DIAGNOSIS — K635 Polyp of colon: Secondary | ICD-10-CM

## 2018-07-28 DIAGNOSIS — F4310 Post-traumatic stress disorder, unspecified: Secondary | ICD-10-CM

## 2018-07-28 DIAGNOSIS — K5792 Diverticulitis of intestine, part unspecified, without perforation or abscess without bleeding: Secondary | ICD-10-CM

## 2018-07-28 DIAGNOSIS — Z789 Other specified health status: Secondary | ICD-10-CM

## 2018-07-28 DIAGNOSIS — F329 Major depressive disorder, single episode, unspecified: Secondary | ICD-10-CM

## 2018-07-28 DIAGNOSIS — T4145XA Adverse effect of unspecified anesthetic, initial encounter: Secondary | ICD-10-CM

## 2018-07-28 DIAGNOSIS — K620 Anal polyp: Secondary | ICD-10-CM

## 2018-08-07 ENCOUNTER — Encounter: Admit: 2018-08-07 | Discharge: 2018-08-07 | Payer: 59

## 2018-08-07 MED ORDER — OXYCODONE 5 MG PO TAB
5 mg | ORAL_TABLET | ORAL | 0 refills | 6.00000 days | Status: AC | PRN
Start: 2018-08-07 — End: 2018-08-13

## 2018-08-13 ENCOUNTER — Encounter: Admit: 2018-08-13 | Discharge: 2018-08-13 | Payer: 59

## 2018-08-13 ENCOUNTER — Ambulatory Visit: Admit: 2018-08-13 | Discharge: 2018-08-13 | Payer: 59

## 2018-08-13 ENCOUNTER — Ambulatory Visit: Admit: 2018-08-13 | Discharge: 2018-08-14 | Payer: 59

## 2018-08-13 DIAGNOSIS — T4145XA Adverse effect of unspecified anesthetic, initial encounter: ICD-10-CM

## 2018-08-13 DIAGNOSIS — C50919 Malignant neoplasm of unspecified site of unspecified female breast: ICD-10-CM

## 2018-08-13 DIAGNOSIS — Z9882 Breast implant status: Principal | ICD-10-CM

## 2018-08-13 DIAGNOSIS — N651 Disproportion of reconstructed breast: ICD-10-CM

## 2018-08-13 DIAGNOSIS — F329 Major depressive disorder, single episode, unspecified: ICD-10-CM

## 2018-08-13 DIAGNOSIS — C50112 Malignant neoplasm of central portion of left female breast: ICD-10-CM

## 2018-08-13 DIAGNOSIS — F4310 Post-traumatic stress disorder, unspecified: ICD-10-CM

## 2018-08-13 DIAGNOSIS — Z923 Personal history of irradiation: ICD-10-CM

## 2018-08-13 DIAGNOSIS — M797 Fibromyalgia: Principal | ICD-10-CM

## 2018-08-13 DIAGNOSIS — Z789 Other specified health status: ICD-10-CM

## 2018-08-13 DIAGNOSIS — N650 Deformity of reconstructed breast: ICD-10-CM

## 2018-08-13 DIAGNOSIS — M1990 Unspecified osteoarthritis, unspecified site: ICD-10-CM

## 2018-08-13 DIAGNOSIS — C50811 Malignant neoplasm of overlapping sites of right female breast: Principal | ICD-10-CM

## 2018-08-13 DIAGNOSIS — T8131XA Disruption of external operation (surgical) wound, not elsewhere classified, initial encounter: ICD-10-CM

## 2018-08-13 DIAGNOSIS — K5792 Diverticulitis of intestine, part unspecified, without perforation or abscess without bleeding: ICD-10-CM

## 2018-08-13 DIAGNOSIS — K620 Anal polyp: ICD-10-CM

## 2018-08-13 DIAGNOSIS — K635 Polyp of colon: ICD-10-CM

## 2018-08-13 MED ORDER — TRAMADOL 50 MG PO TAB
50 mg | ORAL_TABLET | ORAL | 0 refills | Status: AC | PRN
Start: 2018-08-13 — End: ?

## 2018-08-19 ENCOUNTER — Ambulatory Visit: Admit: 2018-08-19 | Discharge: 2018-08-20 | Payer: 59

## 2018-08-19 DIAGNOSIS — Z9889 Other specified postprocedural states: Principal | ICD-10-CM

## 2018-08-22 ENCOUNTER — Encounter: Admit: 2018-08-22 | Discharge: 2018-08-22 | Payer: 59

## 2018-09-14 ENCOUNTER — Encounter: Admit: 2018-09-14 | Discharge: 2018-09-14 | Payer: 59

## 2018-09-14 ENCOUNTER — Emergency Department: Admit: 2018-09-14 | Discharge: 2018-09-14 | Disposition: A | Discharge status: Home / Self Care | Payer: 59

## 2018-09-14 DIAGNOSIS — N6489 Other specified disorders of breast: ICD-10-CM

## 2018-09-14 DIAGNOSIS — K5792 Diverticulitis of intestine, part unspecified, without perforation or abscess without bleeding: ICD-10-CM

## 2018-09-14 DIAGNOSIS — M797 Fibromyalgia: Principal | ICD-10-CM

## 2018-09-14 DIAGNOSIS — T8131XA Disruption of external operation (surgical) wound, not elsewhere classified, initial encounter: ICD-10-CM

## 2018-09-14 DIAGNOSIS — L7634 Postprocedural seroma of skin and subcutaneous tissue following other procedure: Principal | ICD-10-CM

## 2018-09-14 DIAGNOSIS — K635 Polyp of colon: ICD-10-CM

## 2018-09-14 DIAGNOSIS — T4145XA Adverse effect of unspecified anesthetic, initial encounter: ICD-10-CM

## 2018-09-14 DIAGNOSIS — Z789 Other specified health status: ICD-10-CM

## 2018-09-14 DIAGNOSIS — F4310 Post-traumatic stress disorder, unspecified: ICD-10-CM

## 2018-09-14 DIAGNOSIS — F1290 Cannabis use, unspecified, uncomplicated: ICD-10-CM

## 2018-09-14 DIAGNOSIS — K620 Anal polyp: ICD-10-CM

## 2018-09-14 DIAGNOSIS — F329 Major depressive disorder, single episode, unspecified: ICD-10-CM

## 2018-09-14 DIAGNOSIS — C50919 Malignant neoplasm of unspecified site of unspecified female breast: ICD-10-CM

## 2018-09-14 DIAGNOSIS — Z87891 Personal history of nicotine dependence: ICD-10-CM

## 2018-09-14 DIAGNOSIS — M1990 Unspecified osteoarthritis, unspecified site: ICD-10-CM

## 2018-09-14 LAB — GRAM STAIN: Lab: <2

## 2018-09-14 MED ORDER — DIAZEPAM 5 MG PO TAB
5 mg | ORAL | 0 refills | Status: DC | PRN
Start: 2018-09-14 — End: 2018-09-14
  Administered 2018-09-14: 14:00:00 5 mg via ORAL

## 2018-09-14 MED ORDER — TRAMADOL 50 MG PO TAB
50 mg | ORAL_TABLET | ORAL | 0 refills | Status: AC | PRN
Start: 2018-09-14 — End: 2018-09-14

## 2018-09-14 MED ORDER — OXYCODONE 5 MG PO TAB
5 mg | ORAL | 0 refills | Status: DC | PRN
Start: 2018-09-14 — End: 2018-09-14
  Administered 2018-09-14: 14:00:00 5 mg via ORAL

## 2018-09-14 MED ORDER — TRAMADOL 50 MG PO TAB
50 mg | ORAL_TABLET | ORAL | 0 refills | Status: AC | PRN
Start: 2018-09-14 — End: 2018-09-19

## 2018-09-14 MED ORDER — LIDOCAINE-EPINEPHRINE 1 %-1:100,000 IJ SOLN
30 mL | Freq: Once | INTRAMUSCULAR | 0 refills | Status: CP
Start: 2018-09-14 — End: ?
  Administered 2018-09-14: 14:00:00 30 mL via INTRAMUSCULAR

## 2018-09-14 MED ADMIN — SODIUM CHLORIDE 0.9 % IR SOLN [11403]: SUBCUTANEOUS | @ 14:00:00 | Stop: 2018-09-14 | NDC 00338004804

## 2018-09-19 ENCOUNTER — Encounter: Admit: 2018-09-19 | Discharge: 2018-09-19 | Payer: 59

## 2018-09-19 ENCOUNTER — Ambulatory Visit: Admit: 2018-09-19 | Discharge: 2018-09-20 | Payer: 59

## 2018-09-19 DIAGNOSIS — N651 Disproportion of reconstructed breast: ICD-10-CM

## 2018-09-19 DIAGNOSIS — C50919 Malignant neoplasm of unspecified site of unspecified female breast: ICD-10-CM

## 2018-09-19 DIAGNOSIS — K635 Polyp of colon: ICD-10-CM

## 2018-09-19 DIAGNOSIS — C50112 Malignant neoplasm of central portion of left female breast: ICD-10-CM

## 2018-09-19 DIAGNOSIS — T8131XA Disruption of external operation (surgical) wound, not elsewhere classified, initial encounter: ICD-10-CM

## 2018-09-19 DIAGNOSIS — F329 Major depressive disorder, single episode, unspecified: ICD-10-CM

## 2018-09-19 DIAGNOSIS — T4145XA Adverse effect of unspecified anesthetic, initial encounter: ICD-10-CM

## 2018-09-19 DIAGNOSIS — C50811 Malignant neoplasm of overlapping sites of right female breast: Principal | ICD-10-CM

## 2018-09-19 DIAGNOSIS — M797 Fibromyalgia: Principal | ICD-10-CM

## 2018-09-19 DIAGNOSIS — K5792 Diverticulitis of intestine, part unspecified, without perforation or abscess without bleeding: ICD-10-CM

## 2018-09-19 DIAGNOSIS — K620 Anal polyp: ICD-10-CM

## 2018-09-19 DIAGNOSIS — M1990 Unspecified osteoarthritis, unspecified site: ICD-10-CM

## 2018-09-19 DIAGNOSIS — Z789 Other specified health status: ICD-10-CM

## 2018-09-19 DIAGNOSIS — Z923 Personal history of irradiation: ICD-10-CM

## 2018-09-19 DIAGNOSIS — F4310 Post-traumatic stress disorder, unspecified: ICD-10-CM

## 2018-09-19 LAB — CULTURE-ANAEROBIC

## 2018-09-19 LAB — CULTURE-WOUND/TISSUE/FLUID(AEROBIC ONLY)W/SENSITIVITY: Lab: <2

## 2018-09-19 MED ORDER — TRAMADOL 50 MG PO TAB
50 mg | ORAL_TABLET | ORAL | 0 refills | Status: AC | PRN
Start: 2018-09-19 — End: 2018-09-29

## 2018-09-29 ENCOUNTER — Encounter: Admit: 2018-09-29 | Discharge: 2018-09-29 | Payer: 59

## 2018-09-29 ENCOUNTER — Ambulatory Visit: Admit: 2018-09-29 | Discharge: 2018-09-29 | Payer: 59

## 2018-09-29 DIAGNOSIS — K620 Anal polyp: ICD-10-CM

## 2018-09-29 DIAGNOSIS — C50919 Malignant neoplasm of unspecified site of unspecified female breast: ICD-10-CM

## 2018-09-29 DIAGNOSIS — M797 Fibromyalgia: Principal | ICD-10-CM

## 2018-09-29 DIAGNOSIS — T8131XA Disruption of external operation (surgical) wound, not elsewhere classified, initial encounter: Principal | ICD-10-CM

## 2018-09-29 DIAGNOSIS — F329 Major depressive disorder, single episode, unspecified: ICD-10-CM

## 2018-09-29 DIAGNOSIS — Z789 Other specified health status: ICD-10-CM

## 2018-09-29 DIAGNOSIS — K635 Polyp of colon: ICD-10-CM

## 2018-09-29 DIAGNOSIS — F4310 Post-traumatic stress disorder, unspecified: ICD-10-CM

## 2018-09-29 DIAGNOSIS — K5792 Diverticulitis of intestine, part unspecified, without perforation or abscess without bleeding: ICD-10-CM

## 2018-09-29 DIAGNOSIS — M1990 Unspecified osteoarthritis, unspecified site: ICD-10-CM

## 2018-09-29 MED ORDER — POLYETHYLENE GLYCOL 3350 17 GRAM PO PWPK
17 g | Freq: Every day | ORAL | 0 refills | Status: DC
Start: 2018-09-29 — End: 2018-10-01

## 2018-09-29 MED ORDER — OXYCODONE 5 MG PO TAB
5-10 mg | ORAL | 0 refills | Status: DC | PRN
Start: 2018-09-29 — End: 2018-10-01
  Administered 2018-09-30 – 2018-10-01 (×5): 10 mg via ORAL

## 2018-09-29 MED ORDER — HYDROMORPHONE (PF) 2 MG/ML IJ SYRG
.5-1 mg | INTRAVENOUS | 0 refills | Status: DC | PRN
Start: 2018-09-29 — End: 2018-10-01
  Administered 2018-09-30 – 2018-10-01 (×3): 1 mg via INTRAVENOUS
  Administered 2018-10-01: 04:00:00 0.5 mg via INTRAVENOUS

## 2018-09-29 MED ORDER — ENOXAPARIN 40 MG/0.4 ML SC SYRG
40 mg | Freq: Every day | SUBCUTANEOUS | 0 refills | Status: DC
Start: 2018-09-29 — End: 2018-10-01
  Administered 2018-10-01: 01:00:00 40 mg via SUBCUTANEOUS

## 2018-09-29 MED ORDER — DIPHENHYDRAMINE HCL 50 MG/ML IJ SOLN
25 mg | INTRAVENOUS | 0 refills | Status: DC | PRN
Start: 2018-09-29 — End: 2018-10-01
  Administered 2018-09-30 – 2018-10-01 (×2): 25 mg via INTRAVENOUS

## 2018-09-29 MED ORDER — VANCOMYCIN IVPB
15 mg/kg | Freq: Once | INTRAVENOUS | 0 refills | Status: DC
Start: 2018-09-29 — End: 2018-09-30

## 2018-09-29 MED ORDER — PIPERACILLIN/TAZOBACTAM 2.25 G/NS IVPB (MB+)
2.25 g | INTRAVENOUS | 0 refills | Status: DC
Start: 2018-09-29 — End: 2018-09-30
  Administered 2018-09-30 (×4): 2.25 g via INTRAVENOUS

## 2018-09-29 MED ORDER — LACTATED RINGERS IV SOLP
INTRAVENOUS | 0 refills | Status: AC
Start: 2018-09-29 — End: ?
  Administered 2018-09-30 (×2): 1000.000 mL via INTRAVENOUS

## 2018-09-29 MED ORDER — ACETAMINOPHEN 500 MG PO TAB
1000 mg | ORAL | 0 refills | Status: DC
Start: 2018-09-29 — End: 2018-10-01
  Administered 2018-09-30 – 2018-10-01 (×5): 1000 mg via ORAL

## 2018-09-29 MED ORDER — VANCOMYCIN 1,750 MG IVPB
20 mg/kg | Freq: Once | INTRAVENOUS | 0 refills | Status: CP
Start: 2018-09-29 — End: ?
  Administered 2018-09-30 (×2): 1750 mg via INTRAVENOUS

## 2018-09-29 MED ORDER — HYDROXYZINE HCL 25 MG PO TAB
25 mg | ORAL | 0 refills | Status: DC | PRN
Start: 2018-09-29 — End: 2018-10-01
  Administered 2018-09-30: 06:00:00 25 mg via ORAL

## 2018-09-29 MED ORDER — VANCOMYCIN PHARMACY TO MANAGE
1 | 0 refills | Status: DC
Start: 2018-09-29 — End: 2018-10-01

## 2018-09-29 MED ORDER — SENNOSIDES-DOCUSATE SODIUM 8.6-50 MG PO TAB
1 | Freq: Two times a day (BID) | ORAL | 0 refills | Status: DC
Start: 2018-09-29 — End: 2018-10-01
  Administered 2018-10-01: 01:00:00 1 via ORAL

## 2018-09-29 MED ORDER — ONDANSETRON HCL (PF) 4 MG/2 ML IJ SOLN
4 mg | INTRAVENOUS | 0 refills | Status: DC | PRN
Start: 2018-09-29 — End: 2018-10-01

## 2018-09-30 ENCOUNTER — Encounter: Admit: 2018-09-30 | Discharge: 2018-09-30 | Payer: 59

## 2018-09-30 ENCOUNTER — Inpatient Hospital Stay: Admit: 2018-09-30 | Discharge: 2018-09-30 | Payer: 59

## 2018-09-30 ENCOUNTER — Inpatient Hospital Stay: Admit: 2018-09-30 | Discharge: 2018-10-01 | Disposition: A | Discharge status: Home / Self Care | Payer: 59

## 2018-09-30 DIAGNOSIS — T8579XA Infection and inflammatory reaction due to other internal prosthetic devices, implants and grafts, initial encounter: Principal | ICD-10-CM

## 2018-09-30 DIAGNOSIS — K635 Polyp of colon: ICD-10-CM

## 2018-09-30 DIAGNOSIS — Z789 Other specified health status: ICD-10-CM

## 2018-09-30 DIAGNOSIS — M797 Fibromyalgia: Principal | ICD-10-CM

## 2018-09-30 DIAGNOSIS — C50919 Malignant neoplasm of unspecified site of unspecified female breast: ICD-10-CM

## 2018-09-30 DIAGNOSIS — K5792 Diverticulitis of intestine, part unspecified, without perforation or abscess without bleeding: ICD-10-CM

## 2018-09-30 DIAGNOSIS — K620 Anal polyp: ICD-10-CM

## 2018-09-30 DIAGNOSIS — M1990 Unspecified osteoarthritis, unspecified site: ICD-10-CM

## 2018-09-30 DIAGNOSIS — F329 Major depressive disorder, single episode, unspecified: ICD-10-CM

## 2018-09-30 DIAGNOSIS — F4310 Post-traumatic stress disorder, unspecified: ICD-10-CM

## 2018-09-30 LAB — GRAM STAIN

## 2018-09-30 LAB — CBC: Lab: 6.6 10*3/uL — ABNORMAL LOW (ref 4.5–11.0)

## 2018-09-30 LAB — BASIC METABOLIC PANEL: Lab: 143 MMOL/L — ABNORMAL LOW (ref >60)

## 2018-09-30 MED ORDER — FENTANYL CITRATE (PF) 50 MCG/ML IJ SOLN
25 ug | INTRAVENOUS | 0 refills | Status: DC | PRN
Start: 2018-09-30 — End: 2018-09-30
  Administered 2018-09-30: 15:00:00 25 ug via INTRAVENOUS

## 2018-09-30 MED ORDER — PROMETHAZINE 25 MG/ML IJ SOLN
6.25 mg | INTRAVENOUS | 0 refills | Status: DC | PRN
Start: 2018-09-30 — End: 2018-09-30

## 2018-09-30 MED ORDER — OXYCODONE 5 MG PO TAB
5-10 mg | Freq: Once | ORAL | 0 refills | Status: CP | PRN
Start: 2018-09-30 — End: ?
  Administered 2018-09-30: 15:00:00 10 mg via ORAL

## 2018-09-30 MED ORDER — PHENYLEPHRINE IN 0.9% NACL(PF) 1 MG/10 ML (100 MCG/ML) IV SYRG
INTRAVENOUS | 0 refills | Status: DC
Start: 2018-09-30 — End: 2018-09-30
  Administered 2018-09-30: 13:00:00 100 ug via INTRAVENOUS

## 2018-09-30 MED ORDER — ONDANSETRON HCL (PF) 4 MG/2 ML IJ SOLN
4 mg | Freq: Once | INTRAVENOUS | 0 refills | Status: DC | PRN
Start: 2018-09-30 — End: 2018-09-30

## 2018-09-30 MED ORDER — ALPRAZOLAM 0.5 MG PO TAB
.5 mg | Freq: Three times a day (TID) | ORAL | 0 refills | Status: DC | PRN
Start: 2018-09-30 — End: 2018-10-01
  Administered 2018-09-30: 21:00:00 0.5 mg via ORAL

## 2018-09-30 MED ORDER — FENTANYL CITRATE (PF) 50 MCG/ML IJ SOLN
50 ug | INTRAVENOUS | 0 refills | Status: DC | PRN
Start: 2018-09-30 — End: 2018-09-30

## 2018-09-30 MED ORDER — VANCOMYCIN 1,250 MG IVPB
1250 mg | Freq: Two times a day (BID) | INTRAVENOUS | 0 refills | Status: DC
Start: 2018-09-30 — End: 2018-10-01
  Administered 2018-09-30 – 2018-10-01 (×4): 1250 mg via INTRAVENOUS

## 2018-09-30 MED ORDER — MEPERIDINE (PF) 25 MG/ML IJ SYRG
12.5 mg | INTRAVENOUS | 0 refills | Status: DC | PRN
Start: 2018-09-30 — End: 2018-09-30

## 2018-09-30 MED ORDER — DEXAMETHASONE SODIUM PHOSPHATE 4 MG/ML IJ SOLN
INTRAVENOUS | 0 refills | Status: DC
Start: 2018-09-30 — End: 2018-09-30
  Administered 2018-09-30: 13:00:00 4 mg via INTRAVENOUS

## 2018-09-30 MED ORDER — DEXTRAN 70-HYPROMELLOSE (PF) 0.1-0.3 % OP DPET
0 refills | Status: DC
Start: 2018-09-30 — End: 2018-09-30
  Administered 2018-09-30: 13:00:00 2 [drp] via OPHTHALMIC

## 2018-09-30 MED ORDER — LACTATED RINGERS IV SOLP
1000 mL | INTRAVENOUS | 0 refills | Status: DC
Start: 2018-09-30 — End: 2018-10-01
  Administered 2018-09-30: 13:00:00 1000 mL via INTRAVENOUS

## 2018-09-30 MED ORDER — PROPOFOL INJ 10 MG/ML IV VIAL
0 refills | Status: DC
Start: 2018-09-30 — End: 2018-09-30
  Administered 2018-09-30: 13:00:00 100 mg via INTRAVENOUS

## 2018-09-30 MED ORDER — ACETAMINOPHEN 500 MG PO TAB
1000 mg | Freq: Once | ORAL | 0 refills | Status: CP
Start: 2018-09-30 — End: ?
  Administered 2018-09-30: 15:00:00 1000 mg via ORAL

## 2018-09-30 MED ORDER — HALOPERIDOL LACTATE 5 MG/ML IJ SOLN
1 mg | Freq: Once | INTRAVENOUS | 0 refills | Status: DC | PRN
Start: 2018-09-30 — End: 2018-09-30

## 2018-09-30 MED ORDER — KETOROLAC 30 MG/ML (1 ML) IJ SOLN
0 refills | Status: DC
Start: 2018-09-30 — End: 2018-09-30
  Administered 2018-09-30: 14:00:00 30 mg via INTRAVENOUS

## 2018-09-30 MED ORDER — PIPERACILLIN/TAZOBACTAM 3.375 G/NS IVPB (MB+)
3.375 g | INTRAVENOUS | 0 refills | Status: DC
Start: 2018-09-30 — End: 2018-10-01
  Administered 2018-09-30 – 2018-10-01 (×8): 3.375 g via INTRAVENOUS

## 2018-09-30 MED ORDER — ONDANSETRON HCL (PF) 4 MG/2 ML IJ SOLN
INTRAVENOUS | 0 refills | Status: DC
Start: 2018-09-30 — End: 2018-09-30
  Administered 2018-09-30: 14:00:00 4 mg via INTRAVENOUS

## 2018-09-30 MED ORDER — FENTANYL CITRATE (PF) 50 MCG/ML IJ SOLN
0 refills | Status: DC
Start: 2018-09-30 — End: 2018-09-30
  Administered 2018-09-30 (×2): 50 ug via INTRAVENOUS

## 2018-09-30 NOTE — Operative Report(Direct Entry)
OPERATIVE REPORT    Name: Molly Randolph is a 48 y.o. female     DOB: 07/27/1970             MRN#: [id number]    DATE OF OPERATION: 09/30/2018    Surgeon(s) and Role:     * Marlinda Mike, MD - Primary     Kyra Manges, MD - Resident - Assisting        Preoperative Diagnosis:    Postoperative wound dehiscence, initial encounter [T81.31XA] Malignant neoplasm of central portion of left breast in female, estrogen receptor negative (HCC) History of radiation therapy    Post-op Diagnosis      * Postoperative wound dehiscence, initial encounter [T81.31XA]     * Malignant neoplasm of central portion of left breast in female, estrogen receptor negative (HCC) [C50.112, Z17.1]     * History of radiation therapy [Z92.3]    Procedure(s) (LRB):  REMOVAL INTACT MAMMARY IMPLANT (Left)    Anesthesia Type: General    Indications for Procedure - 48 y.o. female with hx of right and left breast cancer.  Hx of b/l SSM.  PMRT to L breast.  Patient had TE on L breast exchanged for implant.  Since surgery patient has issues with healing at the incision, likely secondary to hx of PMRT.   Locally repaired twice.  Patient reported over the weekend continued drainage from L surgical wound.  Given history of failed repair, recommended removal of implant and leave patient flat.  To OR for removal of implant    Findings: No frank evidence of infection.  Seroma present.  Minimal loose ADM    Description and Findings of Operative Procedure: The patient was taken to the operating room. Anesthesia was started. Chest and breasts were prepped in the usual fashion.     We started with the left breast. Incision was made through the previous TE incision.  This was taken in a stairstep fashion to offset the suture lines of closure.  This was taken down to the level of the AlloDerm with the scalpel and electrocautery. Implant was identified and loosened with finger sweep. Implant was removed in toto.  Fluid culture was taken.

## 2018-09-30 NOTE — Progress Notes
0123: patient complaining of itching during vancomycin infusion. Infusion paused and atarax administered.   0200: Patient still complaining of severe itching; IV benadryl administered; itching resolved. MD made aware. VSS. No new orders at this time.

## 2018-09-30 NOTE — H&P (View-Only)
Plastic Surgery H&P                Reason for visit:  Left breast infection    History of Present Illness: Molly Randolph is a 48 y.o. Female admitted for care of left breast infection. Plan for removal today in OR. Feeling ok this AM.    Past Medical History  BARBARANN KELLY has a past medical history of Anal polyp, Arthritis (Lower back.), Breast cancer (HCC) (07/05/2014) (right ILC), Breast cancer (HCC) (2018) (Left Breast ), Colon polyps, Depression, Diverticulitis, Fibromyalgia, Limb alert care status (Right Arm), and PTSD (post-traumatic stress disorder).    Past Surgical History  She has a past surgical history that includes breast biopsy (Right, 07/05/2014); sinus surgery; lymph node biopsy (left neck); tonsillectomy; Endometrial ablation; tubal ligation; Mouth surgery; mastectomy (Right, 08/04/2014); breast reconstruction (Right, 09/16/2014); mastectomy (Left, 10/08/2016) (Left Skin Sparing Mastectomy, Intra-Operative Lymphatic Mapping, Sentinel Lymph Node Biopsy performed by Cordelia Poche, MD at [facility] OR/PERIOP); Breast surgery (Left, 10/08/2016) (INSERTION TISSUE EXPANDER BREAST WITH ALLODERM performed by Marlinda Mike, MD at [facility] OR/PERIOP); portacath placement (Right, 10/08/2016) (INSERTION VENOUS ACCESS PORT performed by Janine Ores, DO at [facility] OR/PERIOP); breast reduction (Bilateral, 10/14/2015) (MASTOPEXY LEFT BREAST FOR SYMMETRY, FAT GRAFTING TO LEFT AND RIGHT BREAST performed by [facility] at [facility]); breast reconstruction (Right, 09/16/2014) (RECONSTRUCTION BREAST WITH IMPLANT performed by Marlinda Mike, MD at [facility] OR/Periop); mastectomy (Right, 08/04/2014) (MASTECTOMY WITH SENTINEL NODE BIOPSY performed by Cordelia Poche, MD at [facility] OR/Periop); Breast surgery (Right, 08/04/2014) (INSERTION TISSUE EXPANDER BREAST performed by Marlinda Mike, MD at [facility] OR/Periop); Colonoscopy (N/A, 01/31/2018) (COLONOSCOPY DIAGNOSTIC WITH SPECIMEN COLLECTION BY BRUSHING/ WASHING - FLEXIBLE performed by Bernita Buffy,

## 2018-09-30 NOTE — Progress Notes
RT Adult Assessment Note    NAME:Molly Randolph             MRN: [id number]             DOB:1970/09/17          AGE: 48 y.o.  ADMISSION DATE: 09/29/2018             DAYS ADMITTED: LOS: 0 days    RT Treatment Plan:            Additional Comments:  Impressions of the patient: No apparent distress  Intervention(s)/outcome(s): None  Patient education that was completed: None  Recommendations to the care team: None    Vital Signs:  Pulse: 83  RR: 18 PER MINUTE  SpO2: 96 %  O2 Device:    Liter Flow:    O2%: 21 %  Breath Sounds:   Clear throughout  Respiratory Effort: Non-Labored

## 2018-09-30 NOTE — Anesthesia Pre-Procedure Evaluation
Anesthesia Pre-Procedure Evaluation    Name: Molly Randolph      MRN: [id number]     DOB: 1970/07/22     Age: 48 y.o.     Sex: female   __________________________________________________________________________     Procedure Date: 09/30/2018   Procedure: Procedure(s) with comments:  REMOVAL INTACT MAMMARY IMPLANT - CASE LENGTH 1.5 HOURS      Physical Assessment  Vital Signs (last filed in past 24 hours):  BP: 152/76 (03/31 0216)  Temp: 36.6 ???C (97.8 ???F) (03/31 0216)  Pulse: 91 (03/31 0216)  Respirations: 16 PER MINUTE (03/31 0216)  SpO2: 95 % (03/31 0216)  Weight: 84.3 kg (185 lb 13.6 oz) (03/30 2153)      Patient History  No Known Allergies     Current Medications    Medication Directions   acetaminophen (TYLENOL) 500 mg tablet Take two tablets by mouth every 6 hours. Max of 4,000 mg of acetaminophen in 24 hours.    Please take 2 Tablets (1000 mg) every 6 hrs for the first three days after your procedure. After three days, you may take Tylenol as needed.   ALPRAZolam (XANAX) 1 mg tablet Take 1 mg by mouth four times daily as needed for Anxiety.   carisoprodol(+) (SOMA) 350 mg tablet Take one tablet by mouth three times daily.   traMADol (ULTRAM) 50 mg tablet Take one tablet by mouth every 6 hours as needed for Pain. Indications: pain   vortioxetine hydrobromide (TRINTELLIX PO) Take 10 mg base by mouth twice daily.         Review of Systems/Medical History      Patient summary reviewed  Nursing notes reviewed  Pertinent labs reviewed    PONV Screening: Female gender, Non-smoker and Postoperative opioids  No history of anesthetic complications  No family history of anesthetic complications      Airway - negative        LMA #4 in the past, Grade I view with Mac 3 last anesthetic      Pulmonary - negative      Not a current smoker (quit in 2015 (29 pack year history))        No shortness of breath      Cardiovascular         Exercise tolerance: >4 METS      Beta Blocker therapy: No      Beta blockers within 24 hours: n/a Induction method: intravenous  NPO status: acceptable      Informed Consent  Anesthetic plan and risks discussed with patient.  Use of blood products discussed with patient      Plan discussed with: anesthesiologist, CRNA and surgeon/proceduralist.

## 2018-10-01 ENCOUNTER — Encounter: Admit: 2018-10-01 | Discharge: 2018-10-01 | Payer: 59

## 2018-10-01 DIAGNOSIS — T8131XA Disruption of external operation (surgical) wound, not elsewhere classified, initial encounter: ICD-10-CM

## 2018-10-01 DIAGNOSIS — T8579XA Infection and inflammatory reaction due to other internal prosthetic devices, implants and grafts, initial encounter: Principal | ICD-10-CM

## 2018-10-01 DIAGNOSIS — Z853 Personal history of malignant neoplasm of breast: ICD-10-CM

## 2018-10-01 DIAGNOSIS — Z87891 Personal history of nicotine dependence: ICD-10-CM

## 2018-10-01 DIAGNOSIS — Z9882 Breast implant status: ICD-10-CM

## 2018-10-01 DIAGNOSIS — Z9012 Acquired absence of left breast and nipple: ICD-10-CM

## 2018-10-01 DIAGNOSIS — N61 Mastitis without abscess: ICD-10-CM

## 2018-10-01 DIAGNOSIS — Z923 Personal history of irradiation: ICD-10-CM

## 2018-10-01 MED ORDER — HYDROCODONE-ACETAMINOPHEN 5-325 MG PO TAB
1-2 | ORAL_TABLET | ORAL | 0 refills | 30.00000 days | Status: AC | PRN
Start: 2018-10-01 — End: ?
  Filled 2018-10-01 (×2): qty 10, 1d supply, fill #1

## 2018-10-01 MED ORDER — ONDANSETRON 4 MG PO TBDI
4 mg | ORAL_TABLET | ORAL | 0 refills | 8.00000 days | Status: AC | PRN
Start: 2018-10-01 — End: ?
  Filled 2018-10-01 (×2): qty 10, 4d supply, fill #1

## 2018-10-01 MED ORDER — SULFAMETHOXAZOLE-TRIMETHOPRIM 800-160 MG PO TAB
1 | ORAL_TABLET | Freq: Two times a day (BID) | ORAL | 0 refills | Status: AC
Start: 2018-10-01 — End: ?
  Filled 2018-10-01 (×2): qty 20, 10d supply, fill #1

## 2018-10-01 NOTE — Care Plan
Pt discharged home to self care, DC paperwork provided. POC discussed prior to DC.     Problem: Skin Integrity  Goal: Skin integrity intact  Outcome: Goal Achieved  Goal: Healing of skin (Wound & Incision)  Outcome: Goal Achieved     Problem: Falls, High Risk of  Goal: Absence of falls-Adult Patient  Outcome: Goal Achieved  Goal: Absence of Falls-Pediatric patient  Outcome: Goal Achieved     Problem: Infection, Risk of  Goal: Absence of infection  Outcome: Goal Achieved  Goal: Knowledge of Infection Control Procedures  Outcome: Goal Achieved

## 2018-10-01 NOTE — Progress Notes
Duffy Rhody discharged on 10/01/2018 at 1013 AM, pt taken to main entrance via wheelchair by Carlena Sax, [place]. Pt provided with dsicharge instructions and education prior to DC. Pt denies questions upon DC. JP drain intact, pt verbalizes understanding of JP care at home. Pt packed all belongings and reports they are in her possession prior to DC.    Marland Kitchen  Discharge instructions reviewed with patient.  Valuables returned: Yes. Pt packed all belongings herself.    .  Home medications: Pt did not bring home meds.    .  Functional assessment at discharge complete: Yes .

## 2018-10-02 LAB — CULTURE-WOUND/TISSUE/FLUID(AEROBIC ONLY)W/SENSITIVITY

## 2018-10-06 LAB — CULTURE-ANAEROBIC

## 2018-10-09 ENCOUNTER — Encounter: Admit: 2018-10-09 | Discharge: 2018-10-09 | Payer: 59

## 2018-10-10 ENCOUNTER — Encounter: Admit: 2018-10-10 | Discharge: 2018-10-10 | Payer: 59

## 2018-10-10 ENCOUNTER — Ambulatory Visit: Admit: 2018-10-10 | Discharge: 2018-10-11 | Payer: 59

## 2018-10-10 DIAGNOSIS — K620 Anal polyp: ICD-10-CM

## 2018-10-10 DIAGNOSIS — C50112 Malignant neoplasm of central portion of left female breast: Principal | ICD-10-CM

## 2018-10-10 DIAGNOSIS — F329 Major depressive disorder, single episode, unspecified: ICD-10-CM

## 2018-10-10 DIAGNOSIS — K5792 Diverticulitis of intestine, part unspecified, without perforation or abscess without bleeding: ICD-10-CM

## 2018-10-10 DIAGNOSIS — M797 Fibromyalgia: Principal | ICD-10-CM

## 2018-10-10 DIAGNOSIS — C50919 Malignant neoplasm of unspecified site of unspecified female breast: ICD-10-CM

## 2018-10-10 DIAGNOSIS — Z923 Personal history of irradiation: ICD-10-CM

## 2018-10-10 DIAGNOSIS — K635 Polyp of colon: ICD-10-CM

## 2018-10-10 DIAGNOSIS — Z789 Other specified health status: ICD-10-CM

## 2018-10-10 DIAGNOSIS — M1990 Unspecified osteoarthritis, unspecified site: ICD-10-CM

## 2018-10-10 DIAGNOSIS — T8131XA Disruption of external operation (surgical) wound, not elsewhere classified, initial encounter: ICD-10-CM

## 2018-10-10 DIAGNOSIS — F4310 Post-traumatic stress disorder, unspecified: ICD-10-CM

## 2018-10-13 LAB — CULTURE-FUNGAL,OTHER: Lab: <2

## 2018-11-03 LAB — CULTURE-TB (AFB)

## 2018-11-05 ENCOUNTER — Encounter: Admit: 2018-11-05 | Discharge: 2018-11-05 | Payer: 59

## 2018-11-06 ENCOUNTER — Encounter: Admit: 2018-11-06 | Discharge: 2018-11-06 | Payer: 59

## 2018-12-03 ENCOUNTER — Encounter: Admit: 2018-12-03 | Discharge: 2018-12-03

## 2018-12-04 ENCOUNTER — Encounter: Admit: 2018-12-04 | Discharge: 2018-12-04

## 2018-12-04 ENCOUNTER — Ambulatory Visit: Admit: 2018-12-04 | Discharge: 2018-12-04

## 2018-12-04 DIAGNOSIS — K635 Polyp of colon: Secondary | ICD-10-CM

## 2018-12-04 DIAGNOSIS — M1990 Unspecified osteoarthritis, unspecified site: Secondary | ICD-10-CM

## 2018-12-04 DIAGNOSIS — M797 Fibromyalgia: Secondary | ICD-10-CM

## 2018-12-04 DIAGNOSIS — F4310 Post-traumatic stress disorder, unspecified: Secondary | ICD-10-CM

## 2018-12-04 DIAGNOSIS — S8265XA Nondisplaced fracture of lateral malleolus of left fibula, initial encounter for closed fracture: Secondary | ICD-10-CM

## 2018-12-04 DIAGNOSIS — C50919 Malignant neoplasm of unspecified site of unspecified female breast: Secondary | ICD-10-CM

## 2018-12-04 DIAGNOSIS — T1490XA Injury, unspecified, initial encounter: Secondary | ICD-10-CM

## 2018-12-04 DIAGNOSIS — K620 Anal polyp: Secondary | ICD-10-CM

## 2018-12-04 DIAGNOSIS — K5792 Diverticulitis of intestine, part unspecified, without perforation or abscess without bleeding: Secondary | ICD-10-CM

## 2018-12-04 DIAGNOSIS — Z789 Other specified health status: Secondary | ICD-10-CM

## 2018-12-04 DIAGNOSIS — F329 Major depressive disorder, single episode, unspecified: Secondary | ICD-10-CM

## 2018-12-05 ENCOUNTER — Encounter: Admit: 2018-12-05 | Discharge: 2018-12-05

## 2018-12-05 DIAGNOSIS — W19XXXA Unspecified fall, initial encounter: Secondary | ICD-10-CM

## 2018-12-05 DIAGNOSIS — S8265XA Nondisplaced fracture of lateral malleolus of left fibula, initial encounter for closed fracture: Secondary | ICD-10-CM

## 2018-12-08 ENCOUNTER — Encounter: Admit: 2018-12-08 | Discharge: 2018-12-08

## 2018-12-08 DIAGNOSIS — K635 Polyp of colon: Secondary | ICD-10-CM

## 2018-12-08 DIAGNOSIS — K620 Anal polyp: Secondary | ICD-10-CM

## 2018-12-08 DIAGNOSIS — M1990 Unspecified osteoarthritis, unspecified site: Secondary | ICD-10-CM

## 2018-12-08 DIAGNOSIS — Z789 Other specified health status: Secondary | ICD-10-CM

## 2018-12-08 DIAGNOSIS — F329 Major depressive disorder, single episode, unspecified: Secondary | ICD-10-CM

## 2018-12-08 DIAGNOSIS — F4310 Post-traumatic stress disorder, unspecified: Secondary | ICD-10-CM

## 2018-12-08 DIAGNOSIS — K5792 Diverticulitis of intestine, part unspecified, without perforation or abscess without bleeding: Secondary | ICD-10-CM

## 2018-12-08 DIAGNOSIS — C50919 Malignant neoplasm of unspecified site of unspecified female breast: Secondary | ICD-10-CM

## 2018-12-08 DIAGNOSIS — M797 Fibromyalgia: Secondary | ICD-10-CM

## 2018-12-08 NOTE — Progress Notes
Date of Service: 12/04/2018    Molly Randolph is a 48 y.o. female.  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  She states that she injured her head her right hemithorax as well as her left wrist and left ankle.  Of note, she has a history of breast cancer.  She reports that during the fall she struck her head on the concrete and noted pain about her left ankle and wrist.  She denies any loss of consciousness but reports a large hematoma formed above her right thigh.  She also thinks that she broke some ribs on the right side.  She presents to my clinic for evaluation of her left wrist and left ankle.    Molly Randolph has a past medical history of Anal polyp, Arthritis (Lower back.), Breast cancer (HCC) (07/05/2014) (right ILC), Breast cancer (HCC) (2018) (Left Breast ), Colon polyps, Depression, Diverticulitis, Fibromyalgia, Limb alert care status (Right Arm), and PTSD (post-traumatic stress disorder).    She has a past surgical history that includes breast biopsy (Right, 07/05/2014); sinus surgery; lymph node biopsy (left neck); tonsillectomy; Endometrial ablation; tubal ligation; Mouth surgery; mastectomy (Right, 08/04/2014); breast reconstruction (Right, 09/16/2014); mastectomy (Left, 10/08/2016) (Left Skin Sparing Mastectomy, Intra-Operative Lymphatic Mapping, Sentinel Lymph Node Biopsy performed by Cordelia Poche, MD at [facility] OR/PERIOP); Breast surgery (Left, 10/08/2016) (INSERTION TISSUE EXPANDER BREAST WITH ALLODERM performed by Marlinda Mike, MD at [facility] OR/PERIOP); portacath placement (Right, 10/08/2016) (INSERTION VENOUS ACCESS PORT performed by Janine Ores, DO at [facility] OR/PERIOP); breast reduction (Bilateral, 10/14/2015) (MASTOPEXY LEFT BREAST FOR SYMMETRY, FAT GRAFTING TO LEFT AND RIGHT BREAST performed by [facility], [facility] at [facility]iop); breast reconstruction (Right, 09/16/2014) (RECONSTRUCTION BREAST WITH IMPLANT performed by Marlinda Mike, MD at [facility]/Periop); mastectomy (Right, 08/04/2014) (MASTECTOMY WITH SENTINEL NODE BIOPSY performed by Cordelia Poche, MD at [facility] OR/Periop); Breast surgery (Right, 08/04/2014) (INSERTION TISSUE EXPANDER BREAST performed by Marlinda Mike, MD at [facility] OR/Periop); Colonoscopy (N/A, 01/31/2018) (COLONOSCOPY DIAGNOSTIC WITH SPECIMEN COLLECTION BY BRUSHING/ WASHING - FLEXIBLE performed by Bernita Buffy, MD at [facility] OR/PERIOP); Breast Capsulectomy (Left, 07/16/2018) (OPEN PERIPROSTHETIC CAPSULOTOMY BREAST performed by Marlinda Mike, MD at [facility] OR/Periop); and TUNNELED VENOUS PORT REMOVAL (Right, 07/16/2018) (REMOVAL TUNNELED CENTRAL VENOUS ACCESS DEVICE INCLUDING PORT/ PUMP performed by Marlinda Mike, MD at [facility] OR/Periop).    Molly Randolph's family history includes Cancer in her maternal grandmother; Essie Christine (age of onset: 85) in her mother; Essie Christine (age of onset: 37) in her maternal aunt; Cancer-Colon (age of onset: 46) in her mother.      Social History  She is a former smoker.  She states that she smoked a pack a day for 29 years.  She denies any alcohol consumption.  She reports that she does occasionally use marijuana.  She denies any other illicit drug use.  She works as a Museum/gallery curator.      Review of Systems  She reports right sided rib pain.  She reports left wrist pain.  She reports left ankle pain.  She also reports some lumbar spine pain.  She reports headaches.  She denies any loss of consciousness with the trauma.  She reports some dizziness.  The remainder of her 14 point review of systems is otherwise unremarkable.    Vitals:    12/04/18 1139   Weight: 81.7 kg (180 lb 1.9 oz)   Height: 165.1 cm (65)  Body mass index is 29.97 kg/m???.     Physical Exam:  She is an appropriately developed well-nourished female appearing her stated age in no acute distress.  Her HEENT exam reveals that she is normocephalic and does demonstrate evidence of trauma about the right eye. She has a mobile hematoma just off the lateral border of the eye.  She has no obvious visual disturbances.  There is no nystagmus present.  Her mucous membranes are moist.  Her neck is supple.  External ocular muscles remain intact.  Chest exam reveals bilateral breath sounds which are equal.  Heart demonstrates a regular rhythm with a rate of approximately 80.  She has poor inspiratory and end expiratory effort noted.  Abdomen is soft.  Bilateral upper extremities demonstrate full functional range of motion at the shoulders elbows and wrists.  She has no swelling about her left wrist whatsoever.  She has no tenderness with aggressive palpation about the wrist.  She is able to flex and extend equally bilaterally.  She demonstrates excellent pronation and supination of the forearm on the left.  She has no evidence of trauma about the left wrist whatsoever.  Grip strength is excellent.  She has excellent sensation through the radial median and ulnar nerve distributions.  Bilateral lower extremities are atraumatic at the level of the skin with functional range of motion at the hips knees and ankles.  She does have some mild swelling present about the lateral malleolus on the left.  There is mild tenderness with palpation about the lateral malleolus without deformity noted.  She is able to dorsiflex the left ankle to 5 degrees whereas the right ankle dorsiflexes to about 10 degrees.  She plantar flexes the left ankle to about 25 to 30 degrees whereas the left ankle is approximately 10 degrees more.  She is ambulatory but does favor the left ankle.  She is weightbearing as tolerated on her left lower extremity.  She has a Low Profile cam boot which she utilizes on the left side however she states that this exacerbates her lateral ankle pain.    X-rays obtained today include AP mortise and lateral views of the left ankle.  Also obtained AP oblique and lateral views of the left wrist. Ankle films demonstrate a nondisplaced lateral malleolus fracture.  The ankle mortise is preserved in the joint space is well-maintained.  This is a nondisplaced fracture of the lateral malleolus just below the level of the plafond.  Wrist films demonstrate no evidence of fracture.  She has an old ossicle about the tip of her ulnar styloid.  This is well-corticated and does demonstrate chronic changes as opposed to an acute finding.  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    Assessment:  Nondisplaced left lateral malleolus fracture.    Plan:  Given the fact that she voices her noncompliance with respect to the cam boot due to its lack of comfort as well as its cumbersome usage, I would recommend that we place her into a Stromgren ankle wrap.  This will provide her with excellent ankle stability and allow her the opportunity to continue to bear weight on this nondisplaced fracture.  I do not anticipate any displacement of the fracture over the course of the next several weeks as this is akin to a bony equivalent of an ankle sprain.  Therefore, I have recommended that she be placed into a Stromgren ankle wrap and be allowed to bear weight in a pair of shoes.  I would like to  see her back to reassess her clinically and radiographically with x-rays of the ankle only.  With respect to her head trauma I would recommend that she be seen by neurology.  I think that it would be appropriate to have her evaluated by a concussion specialist.  I had the opportunity to discuss all of this with her and the opportunity to answer all of her questions prior to her departure from clinic today.

## 2018-12-10 ENCOUNTER — Encounter: Admit: 2018-12-10 | Discharge: 2018-12-10

## 2018-12-10 NOTE — Telephone Encounter
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 Shared voiced concerns per patient request with Dr. Marianne Sofia and notified Herby Abraham, ortho worker's comp coordinator.    Confirmed employer of patient received appointment note and work status form 12/04/18. Patient provided the following contact information during appointment:  [name] (P: [phone]/M: [phone]) and forwarded to [facility] for reference as well.

## 2018-12-11 NOTE — Telephone Encounter
Left message asking patient to call back Radiology Scheduling at [phone] to schedule. Anselmo Pickler, RN

## 2018-12-17 ENCOUNTER — Encounter: Admit: 2018-12-17 | Discharge: 2018-12-17

## 2018-12-23 ENCOUNTER — Encounter: Admit: 2018-12-23 | Discharge: 2018-12-23

## 2018-12-23 NOTE — Telephone Encounter
LVM for pt to call back radiology scheduling at [phone]. BStrickland, RN

## 2018-12-29 ENCOUNTER — Encounter: Admit: 2018-12-29 | Discharge: 2018-12-29

## 2019-01-01 ENCOUNTER — Ambulatory Visit: Admit: 2019-01-01 | Discharge: 2019-01-01

## 2019-01-01 ENCOUNTER — Encounter: Admit: 2019-01-01 | Discharge: 2019-01-01

## 2019-01-01 DIAGNOSIS — M1990 Unspecified osteoarthritis, unspecified site: Secondary | ICD-10-CM

## 2019-01-01 DIAGNOSIS — F329 Major depressive disorder, single episode, unspecified: Secondary | ICD-10-CM

## 2019-01-01 DIAGNOSIS — Z789 Other specified health status: Secondary | ICD-10-CM

## 2019-01-01 DIAGNOSIS — T1490XA Injury, unspecified, initial encounter: Secondary | ICD-10-CM

## 2019-01-01 DIAGNOSIS — K620 Anal polyp: Secondary | ICD-10-CM

## 2019-01-01 DIAGNOSIS — K5792 Diverticulitis of intestine, part unspecified, without perforation or abscess without bleeding: Secondary | ICD-10-CM

## 2019-01-01 DIAGNOSIS — S8265XA Nondisplaced fracture of lateral malleolus of left fibula, initial encounter for closed fracture: Secondary | ICD-10-CM

## 2019-01-01 DIAGNOSIS — F4310 Post-traumatic stress disorder, unspecified: Secondary | ICD-10-CM

## 2019-01-01 DIAGNOSIS — S8265XD Nondisplaced fracture of lateral malleolus of left fibula, subsequent encounter for closed fracture with routine healing: Secondary | ICD-10-CM

## 2019-01-01 DIAGNOSIS — K635 Polyp of colon: Secondary | ICD-10-CM

## 2019-01-01 DIAGNOSIS — C50919 Malignant neoplasm of unspecified site of unspecified female breast: Secondary | ICD-10-CM

## 2019-01-01 DIAGNOSIS — M797 Fibromyalgia: Secondary | ICD-10-CM

## 2019-01-12 NOTE — Progress Notes
Date of Service: 01/01/2019    Subjective:              The patient returns today for follow-up and evaluation of her left lateral malleolus ankle fracture.  This was a Weber a type fracture of the lateral malleolus.  This was nondisplaced.  She reports improvement.    History of Present Illness  Molly Randolph is a 48 y.o. female.     Review of Systems      Objective:         She remains neurovascularly intact to the left lower extremity.  She has no tenderness but a slight aching feeling in the peroneal musculature posterior to the fracture.  She tolerated direct palpation over the fracture line without symptoms.  She demonstrates dorsiflexion of 5 degrees and plantarflexion of about 35 degrees without crepitus.  She does have weakness and slight pain with abduction of the hindfoot against resistance.  She is weightbearing as tolerated in her Stromgren ankle wrap.    Radiographs:  AP lateral and mortise views of the ankle demonstrate some interval consolidation at the fracture line.  The ankle mortise is preserved in the joint space is well-maintained.  No displacement of the fracture is identified.         Assessment:  Left nondisplaced lateral malleolus ankle fracture.    Plan:  Given these findings I would recommend that she could return to work with some restriction.  She was provided the necessary paperwork for this.  With respect to her ankle I recommended that she continue to bear weight as tolerated in her ankle wrap.  I recommended some physical therapy to work on range of motion peroneal strengthening and proprioceptive exercises.  I would like to see her back in 6 weeks to reassess her clinically and radiographically.  I had the opportunity to answer all of her questions prior to her departure from clinic today.

## 2019-02-05 ENCOUNTER — Encounter: Admit: 2019-02-05 | Discharge: 2019-02-05

## 2019-02-05 DIAGNOSIS — M25572 Pain in left ankle and joints of left foot: Secondary | ICD-10-CM

## 2019-03-02 ENCOUNTER — Encounter: Admit: 2019-03-02 | Discharge: 2019-03-02

## 2019-03-02 NOTE — Telephone Encounter
LVM for pt to call back radiology scheduling at [phone]. Anselmo Pickler, RN

## 2019-04-06 NOTE — Telephone Encounter
LVM with PCP office regarding our multiple attempts to schedule pt for her CT chest. The order is now expired. Left my call back number regarding this matter.

## 2019-04-06 NOTE — Telephone Encounter
Nurse called back from Dr. Jackson Latino office stating she did not know about an order that was placed from Dr. Gilford Rile. Informed her there were recommendations for follow up from a previous CT chest scan and we have tried to contact the pt multiple times to schedule her for f/u imaging. Nurse voiced understanding and stated that she would place a note for Dr. Gilford Rile to review the imaging recommendations again so he can address them with the pt when she returns in the fall for an appt.

## 2019-05-07 ENCOUNTER — Encounter: Admit: 2019-05-07 | Discharge: 2019-05-07 | Payer: 59

## 2019-05-07 ENCOUNTER — Ambulatory Visit: Admit: 2019-05-07 | Discharge: 2019-05-07 | Payer: 59

## 2019-05-07 DIAGNOSIS — M797 Fibromyalgia: Secondary | ICD-10-CM

## 2019-05-07 DIAGNOSIS — C50919 Malignant neoplasm of unspecified site of unspecified female breast: Secondary | ICD-10-CM

## 2019-05-07 DIAGNOSIS — K5792 Diverticulitis of intestine, part unspecified, without perforation or abscess without bleeding: Secondary | ICD-10-CM

## 2019-05-07 DIAGNOSIS — F329 Major depressive disorder, single episode, unspecified: Secondary | ICD-10-CM

## 2019-05-07 DIAGNOSIS — M25572 Pain in left ankle and joints of left foot: Secondary | ICD-10-CM

## 2019-05-07 DIAGNOSIS — S8265XD Nondisplaced fracture of lateral malleolus of left fibula, subsequent encounter for closed fracture with routine healing: Secondary | ICD-10-CM

## 2019-05-07 DIAGNOSIS — Z789 Other specified health status: Secondary | ICD-10-CM

## 2019-05-07 DIAGNOSIS — M1990 Unspecified osteoarthritis, unspecified site: Secondary | ICD-10-CM

## 2019-05-07 DIAGNOSIS — K635 Polyp of colon: Secondary | ICD-10-CM

## 2019-05-07 DIAGNOSIS — K620 Anal polyp: Secondary | ICD-10-CM

## 2019-05-07 DIAGNOSIS — F4310 Post-traumatic stress disorder, unspecified: Secondary | ICD-10-CM

## 2019-05-07 NOTE — Progress Notes
Date of Service: 05/07/2019    Subjective:              The patient returns today for follow-up and evaluation of her nondisplaced left lateral malleolus ankle fracture.  She reports occasional achiness.  She is requesting a return to work without restrictions.    History of Present Illness  Molly Randolph is a 48 y.o. female.     Review of Systems      Objective:         She remains neurovascularly intact to the left lower extremity.  She has no tenderness with aggressive palpation over the previous fracture site.  There is no swelling present.  She demonstrates dorsiflexion of about 10 degrees and plantar flexion of about 35 degrees.  There is no crepitus or pain with this range of motion.  She is able to tolerate toe standing bilaterally.  She has some mild weakness with single limb toe stand but is able to do so for a brief period.    Radiographs:  AP lateral and mortise views of the ankle demonstrate interval consolidation and a healed lateral malleolus.  This was a Weber a fracture.  There is excellent preservation of the ankle mortise and the joint space is well-maintained.         Assessment:  Healed left lateral malleolus ankle fracture.    Plan:  Given these findings I recommended a prophylactic ankle sleeve from a sporting goods store when she is active.  Otherwise I think she could continue to work on therapy to include toe standing exercises and range of motion.  I did offer her formal physical therapy which she politely declined.  I recommended nonsteroidal anti-inflammatories for any discomfort that she may have.  I invited her to return to clinic as needed.  Additionally, I was able to provide her with a work status to allow her to return to work without any restrictions.  I had the opportunity to answer all of her questions prior to her departure from clinic today.

## 2019-06-02 ENCOUNTER — Encounter: Admit: 2019-06-02 | Discharge: 2019-06-02 | Payer: 59

## 2019-06-02 ENCOUNTER — Emergency Department: Admit: 2019-06-02 | Discharge: 2019-06-02 | Payer: 59

## 2019-06-02 DIAGNOSIS — F329 Major depressive disorder, single episode, unspecified: Secondary | ICD-10-CM

## 2019-06-02 DIAGNOSIS — R519 Nonintractable episodic headache, unspecified headache type: Secondary | ICD-10-CM

## 2019-06-02 DIAGNOSIS — K620 Anal polyp: Secondary | ICD-10-CM

## 2019-06-02 DIAGNOSIS — K5792 Diverticulitis of intestine, part unspecified, without perforation or abscess without bleeding: Secondary | ICD-10-CM

## 2019-06-02 DIAGNOSIS — K635 Polyp of colon: Secondary | ICD-10-CM

## 2019-06-02 DIAGNOSIS — Z789 Other specified health status: Secondary | ICD-10-CM

## 2019-06-02 DIAGNOSIS — K529 Noninfective gastroenteritis and colitis, unspecified: Secondary | ICD-10-CM

## 2019-06-02 DIAGNOSIS — M797 Fibromyalgia: Secondary | ICD-10-CM

## 2019-06-02 DIAGNOSIS — F4310 Post-traumatic stress disorder, unspecified: Secondary | ICD-10-CM

## 2019-06-02 DIAGNOSIS — M1990 Unspecified osteoarthritis, unspecified site: Secondary | ICD-10-CM

## 2019-06-02 DIAGNOSIS — C50919 Malignant neoplasm of unspecified site of unspecified female breast: Secondary | ICD-10-CM

## 2019-06-02 LAB — COMPREHENSIVE METABOLIC PANEL
Lab: 10 mg/dL (ref 8.5–10.6)
Lab: 102 mg/dL — ABNORMAL HIGH (ref 70–100)
Lab: 11 mg/dL (ref 7–25)
Lab: 12 K/UL (ref 3–12)
Lab: 141 MMOL/L (ref 137–147)
Lab: 26 MMOL/L (ref 21–30)
Lab: 7.2 g/dL (ref 6.0–8.0)
Lab: >60 mL/min (ref >60)
Lab: >60 mL/min — AB (ref >60)

## 2019-06-02 LAB — MAGNESIUM: Lab: 1.4 mg/dL — ABNORMAL LOW (ref 1.6–2.6)

## 2019-06-02 LAB — URINALYSIS DIPSTICK REFLEX TO CULTURE
Lab: NEGATIVE K/UL (ref 0–0.45)
Lab: NEGATIVE U/L (ref 7–56)

## 2019-06-02 LAB — CBC AND DIFF
Lab: 0 10*3/uL (ref 0–0.20)
Lab: 27 % (ref 24–44)
Lab: 8 10*3/uL (ref 4.5–11.0)
Lab: 17 (ref <20.7)

## 2019-06-02 LAB — LIPASE: Lab: 19 U/L (ref 11–82)

## 2019-06-02 LAB — URINALYSIS MICROSCOPIC REFLEX TO CULTURE

## 2019-06-02 LAB — POC LACTATE: Lab: 1.9 MMOL/L (ref 0.5–2.0)

## 2019-06-02 LAB — POC TROPONIN: Lab: 0 ng/mL (ref 0.00–0.05)

## 2019-06-02 LAB — POC CREATININE, RAD: Lab: 0.5 mg/dL (ref 0.4–1.00)

## 2019-06-02 MED ORDER — DICYCLOMINE 10 MG PO CAP
10 mg | Freq: Once | ORAL | 0 refills | Status: CP
Start: 2019-06-02 — End: ?
  Administered 2019-06-02: 20:00:00 10 mg via ORAL

## 2019-06-02 MED ORDER — IOHEXOL 350 MG IODINE/ML IV SOLN
80 mL | Freq: Once | INTRAVENOUS | 0 refills | Status: CP
Start: 2019-06-02 — End: ?
  Administered 2019-06-02: 18:00:00 80 mL via INTRAVENOUS

## 2019-06-02 MED ORDER — SODIUM CHLORIDE 0.9 % IJ SOLN
50 mL | Freq: Once | INTRAVENOUS | 0 refills | Status: CP
Start: 2019-06-02 — End: ?
  Administered 2019-06-02: 18:00:00 50 mL via INTRAVENOUS

## 2019-06-02 MED ORDER — KETOROLAC 15 MG/ML IJ SOLN
15 mg | Freq: Once | INTRAVENOUS | 0 refills | Status: CP
Start: 2019-06-02 — End: ?
  Administered 2019-06-02: 20:00:00 15 mg via INTRAVENOUS

## 2019-06-02 MED ORDER — SIMETHICONE 80 MG PO CHEW
40 mg | Freq: Once | ORAL | 0 refills | Status: CP
Start: 2019-06-02 — End: ?
  Administered 2019-06-02: 20:00:00 40 mg via ORAL

## 2019-06-02 MED ORDER — LACTATED RINGERS IV SOLP
1000 mL | Freq: Once | INTRAVENOUS | 0 refills | Status: CP
Start: 2019-06-02 — End: ?
  Administered 2019-06-02: 19:00:00 1000 mL via INTRAVENOUS

## 2019-06-02 MED ORDER — ACETAMINOPHEN 325 MG PO TAB
650 mg | Freq: Once | ORAL | 0 refills | Status: CP
Start: 2019-06-02 — End: ?
  Administered 2019-06-02: 20:00:00 650 mg via ORAL

## 2019-06-02 MED ORDER — MAGNESIUM SULFATE IN D5W 1 GRAM/100 ML IV PGBK
1 g | Freq: Once | INTRAVENOUS | 0 refills | Status: CP
Start: 2019-06-02 — End: ?
  Administered 2019-06-02: 20:00:00 1 g via INTRAVENOUS

## 2019-06-02 NOTE — ED Notes
Pt presents to ED40 CC abdominal pain with diarrhea beginning aprox. 3 days ago. Pt is AOx4, denies SOA or chest pain at this time. Pt reports history of breast cancer, with bilateral mastectomies. 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     Belongings: Clothing, cell phone.

## 2019-09-18 ENCOUNTER — Encounter: Admit: 2019-09-18 | Discharge: 2019-09-18 | Payer: 59

## 2020-02-04 ENCOUNTER — Encounter: Admit: 2020-02-04 | Discharge: 2020-02-04 | Payer: 59

## 2020-03-22 ENCOUNTER — Encounter: Admit: 2020-03-22 | Discharge: 2020-03-22 | Payer: 59

## 2020-03-22 DIAGNOSIS — K620 Anal polyp: Secondary | ICD-10-CM

## 2020-03-22 DIAGNOSIS — C50919 Malignant neoplasm of unspecified site of unspecified female breast: Secondary | ICD-10-CM

## 2020-03-22 DIAGNOSIS — M1990 Unspecified osteoarthritis, unspecified site: Secondary | ICD-10-CM

## 2020-03-22 DIAGNOSIS — F4310 Post-traumatic stress disorder, unspecified: Secondary | ICD-10-CM

## 2020-03-22 DIAGNOSIS — Z789 Other specified health status: Secondary | ICD-10-CM

## 2020-03-22 DIAGNOSIS — K635 Polyp of colon: Secondary | ICD-10-CM

## 2020-03-22 DIAGNOSIS — R2232 Localized swelling, mass and lump, left upper limb: Secondary | ICD-10-CM

## 2020-03-22 DIAGNOSIS — M797 Fibromyalgia: Secondary | ICD-10-CM

## 2020-03-22 DIAGNOSIS — K5792 Diverticulitis of intestine, part unspecified, without perforation or abscess without bleeding: Secondary | ICD-10-CM

## 2020-03-22 DIAGNOSIS — F329 Major depressive disorder, single episode, unspecified: Secondary | ICD-10-CM

## 2020-03-22 DIAGNOSIS — C50911 Malignant neoplasm of unspecified site of right female breast: Secondary | ICD-10-CM

## 2020-07-05 ENCOUNTER — Encounter: Admit: 2020-07-05 | Discharge: 2020-07-05 | Payer: 59

## 2020-07-05 DIAGNOSIS — M545 Acute midline low back pain, unspecified whether sciatica present: Secondary | ICD-10-CM

## 2020-07-05 DIAGNOSIS — C50911 Malignant neoplasm of unspecified site of right female breast: Secondary | ICD-10-CM

## 2020-07-05 NOTE — Telephone Encounter
Confirmed appt with PT.

## 2020-07-05 NOTE — Progress Notes
Patient reported with severe hip pain and lower back pain. Patient reported difficulty to walk. Patient denies numbness or tingling of her LE's. She was able to control her bowel and bladder.   Patient went to ER at [facility], records will be requested. Her symptoms discussed with Dr.Khan recommended staging scan of CT CAP and bone scan. Patient with the plan and message sent to [facility] to assist with appointments.   Her PCP Dr.Winter's nurse is faxing over the reports for [place].

## 2020-07-26 ENCOUNTER — Encounter: Admit: 2020-07-26 | Discharge: 2020-07-26 | Payer: 59

## 2020-07-26 MED ORDER — HYDROCODONE-ACETAMINOPHEN 5-325 MG PO TAB
1 | ORAL_TABLET | ORAL | 0 refills | 15.00000 days | Status: AC | PRN
Start: 2020-07-26 — End: ?

## 2020-07-26 NOTE — Progress Notes
Molly Randolph called to report continued pain in bilateral hips and lower back- OTC meds  Not helping- states she ran out of  Prescription pain medication she received from her visit to [place] on 07/05/2020  She is scheduled for bone scan and CT CAP on 08/02/20 here at [place] and f/u with Dr Humphrey Rolls on 2/4    Prescription sent to [facility] on [address]  at her request     faxed to [facility] to obtain records from 07/05/2020 visit

## 2020-08-02 ENCOUNTER — Encounter: Admit: 2020-08-02 | Discharge: 2020-08-02 | Payer: 59

## 2020-08-02 DIAGNOSIS — C50911 Malignant neoplasm of unspecified site of right female breast: Secondary | ICD-10-CM

## 2020-08-02 DIAGNOSIS — M545 Acute midline low back pain, unspecified whether sciatica present: Secondary | ICD-10-CM

## 2020-08-02 LAB — COMPREHENSIVE METABOLIC PANEL
Lab: 0.6 mg/dL (ref 0.4–1.00)
Lab: 0.7 mg/dL (ref 0.3–1.2)
Lab: 104 MMOL/L (ref 98–110)
Lab: 13 U/L (ref 7–40)
Lab: 13 mg/dL (ref 7–25)
Lab: 137 MMOL/L — ABNORMAL LOW (ref 137–147)
Lab: 27 MMOL/L (ref 21–30)
Lab: 3.5 MMOL/L — ABNORMAL LOW (ref 3.5–5.1)
Lab: 4 g/dL (ref 3.5–5.0)
Lab: 6 K/UL (ref 3–12)
Lab: 62 U/L (ref 25–110)
Lab: 7.2 g/dL (ref 6.0–8.0)
Lab: 8 U/L (ref 7–56)
Lab: 89 mg/dL (ref 70–100)
Lab: 9 mg/dL (ref 8.5–10.6)
Lab: >60 mL/min (ref >60)

## 2020-08-02 LAB — CBC AND DIFF
Lab: 0 K/UL (ref 0–0.20)
Lab: 0.2 K/UL (ref 0–0.45)
Lab: 3.7 M/UL — ABNORMAL LOW (ref 4.0–5.0)
Lab: 4.9 K/UL (ref 4.5–11.0)

## 2020-08-02 MED ORDER — RP DX TC-99M MEDRONATE MCI
25 | Freq: Once | INTRAVENOUS | 0 refills | Status: CP
Start: 2020-08-02 — End: ?
  Administered 2020-08-02: 17:00:00 26.2 via INTRAVENOUS

## 2020-08-02 MED ORDER — SODIUM CHLORIDE 0.9 % IJ SOLN
50 mL | Freq: Once | INTRAVENOUS | 0 refills | Status: CP
Start: 2020-08-02 — End: ?
  Administered 2020-08-02: 17:00:00 50 mL via INTRAVENOUS

## 2020-08-02 MED ORDER — IOHEXOL 350 MG IODINE/ML IV SOLN
100 mL | Freq: Once | INTRAVENOUS | 0 refills | Status: CP
Start: 2020-08-02 — End: ?
  Administered 2020-08-02: 17:00:00 100 mL via INTRAVENOUS

## 2020-08-05 ENCOUNTER — Encounter: Admit: 2020-08-05 | Discharge: 2020-08-05 | Payer: 59

## 2020-08-05 DIAGNOSIS — C50912 Malignant neoplasm of unspecified site of left female breast: Secondary | ICD-10-CM

## 2020-08-05 DIAGNOSIS — M1990 Unspecified osteoarthritis, unspecified site: Secondary | ICD-10-CM

## 2020-08-05 DIAGNOSIS — F4310 Post-traumatic stress disorder, unspecified: Secondary | ICD-10-CM

## 2020-08-05 DIAGNOSIS — M797 Fibromyalgia: Secondary | ICD-10-CM

## 2020-08-05 DIAGNOSIS — C50919 Malignant neoplasm of unspecified site of unspecified female breast: Secondary | ICD-10-CM

## 2020-08-05 DIAGNOSIS — Z789 Other specified health status: Secondary | ICD-10-CM

## 2020-08-05 DIAGNOSIS — K5792 Diverticulitis of intestine, part unspecified, without perforation or abscess without bleeding: Secondary | ICD-10-CM

## 2020-08-05 DIAGNOSIS — K620 Anal polyp: Secondary | ICD-10-CM

## 2020-08-05 DIAGNOSIS — F32A Depression: Secondary | ICD-10-CM

## 2020-08-05 DIAGNOSIS — K635 Polyp of colon: Secondary | ICD-10-CM

## 2020-08-18 IMAGING — CR CHEST
2 series · 2 of 2 positions shown · non-contrast
Comparison: none

[chest pa x-wise]
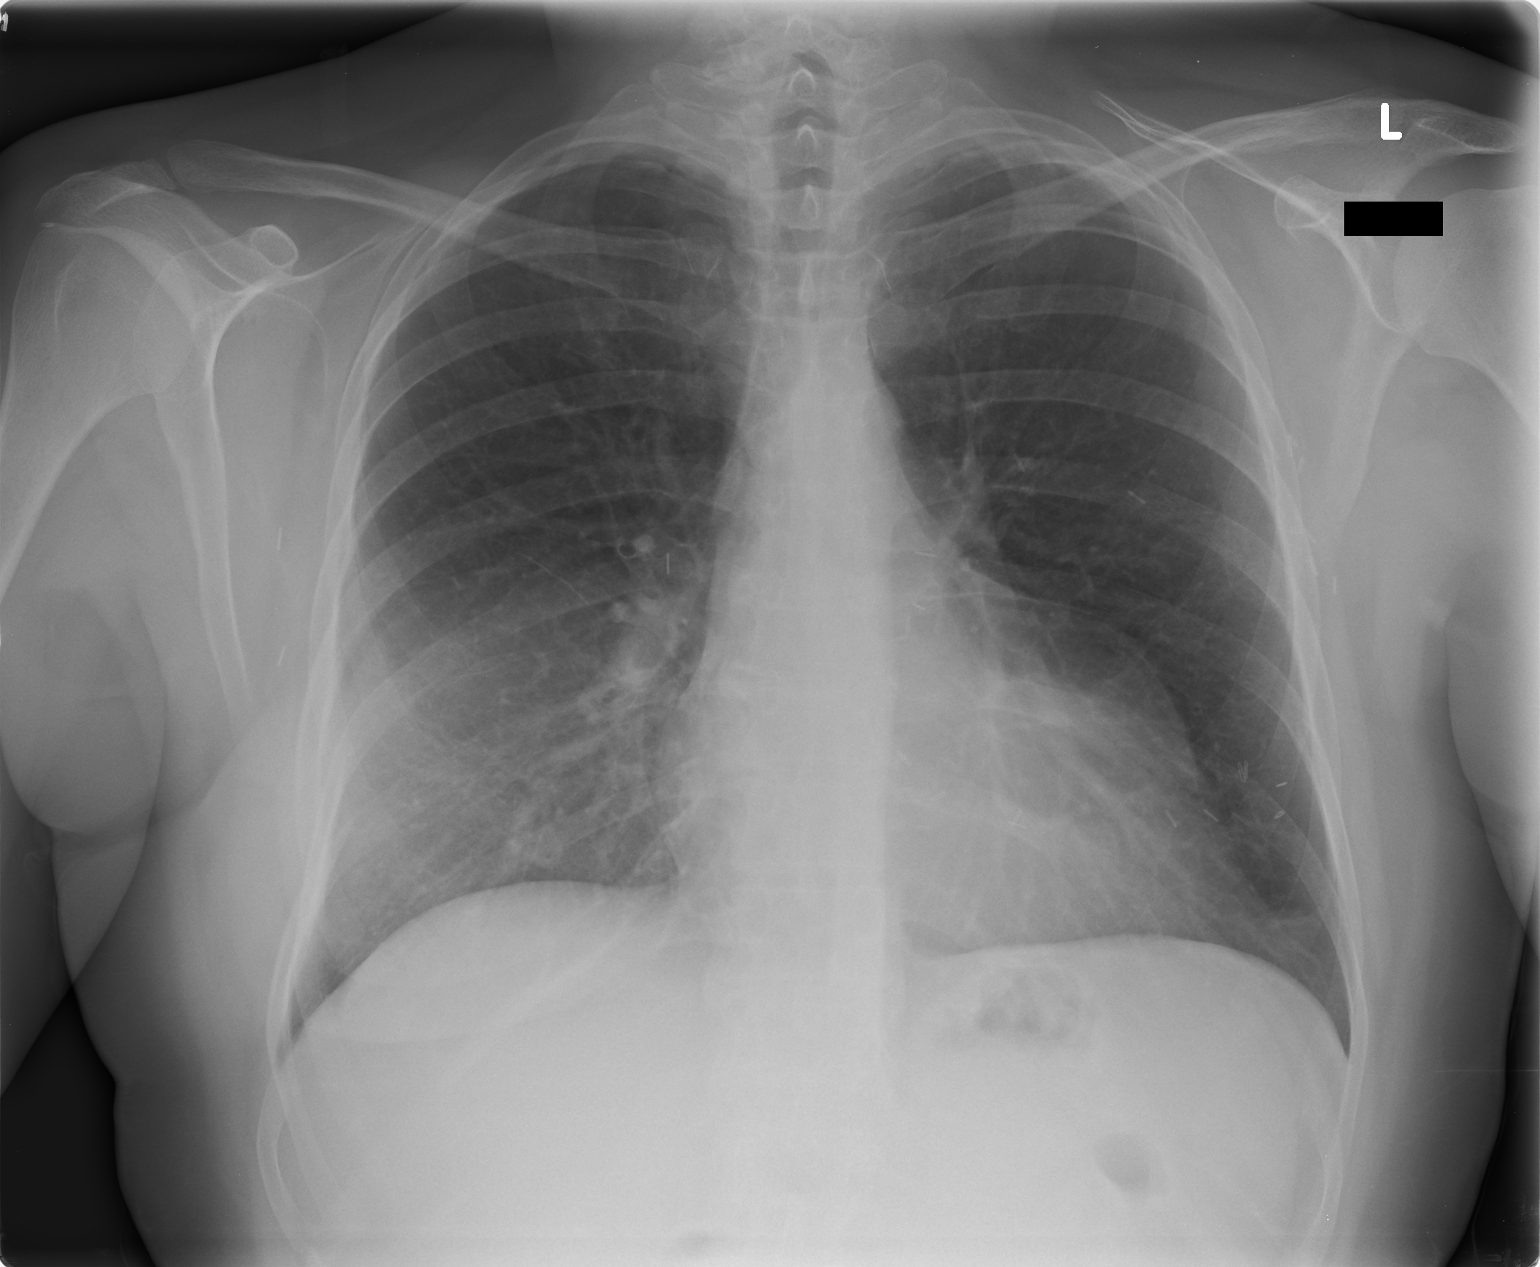

[chest lat]
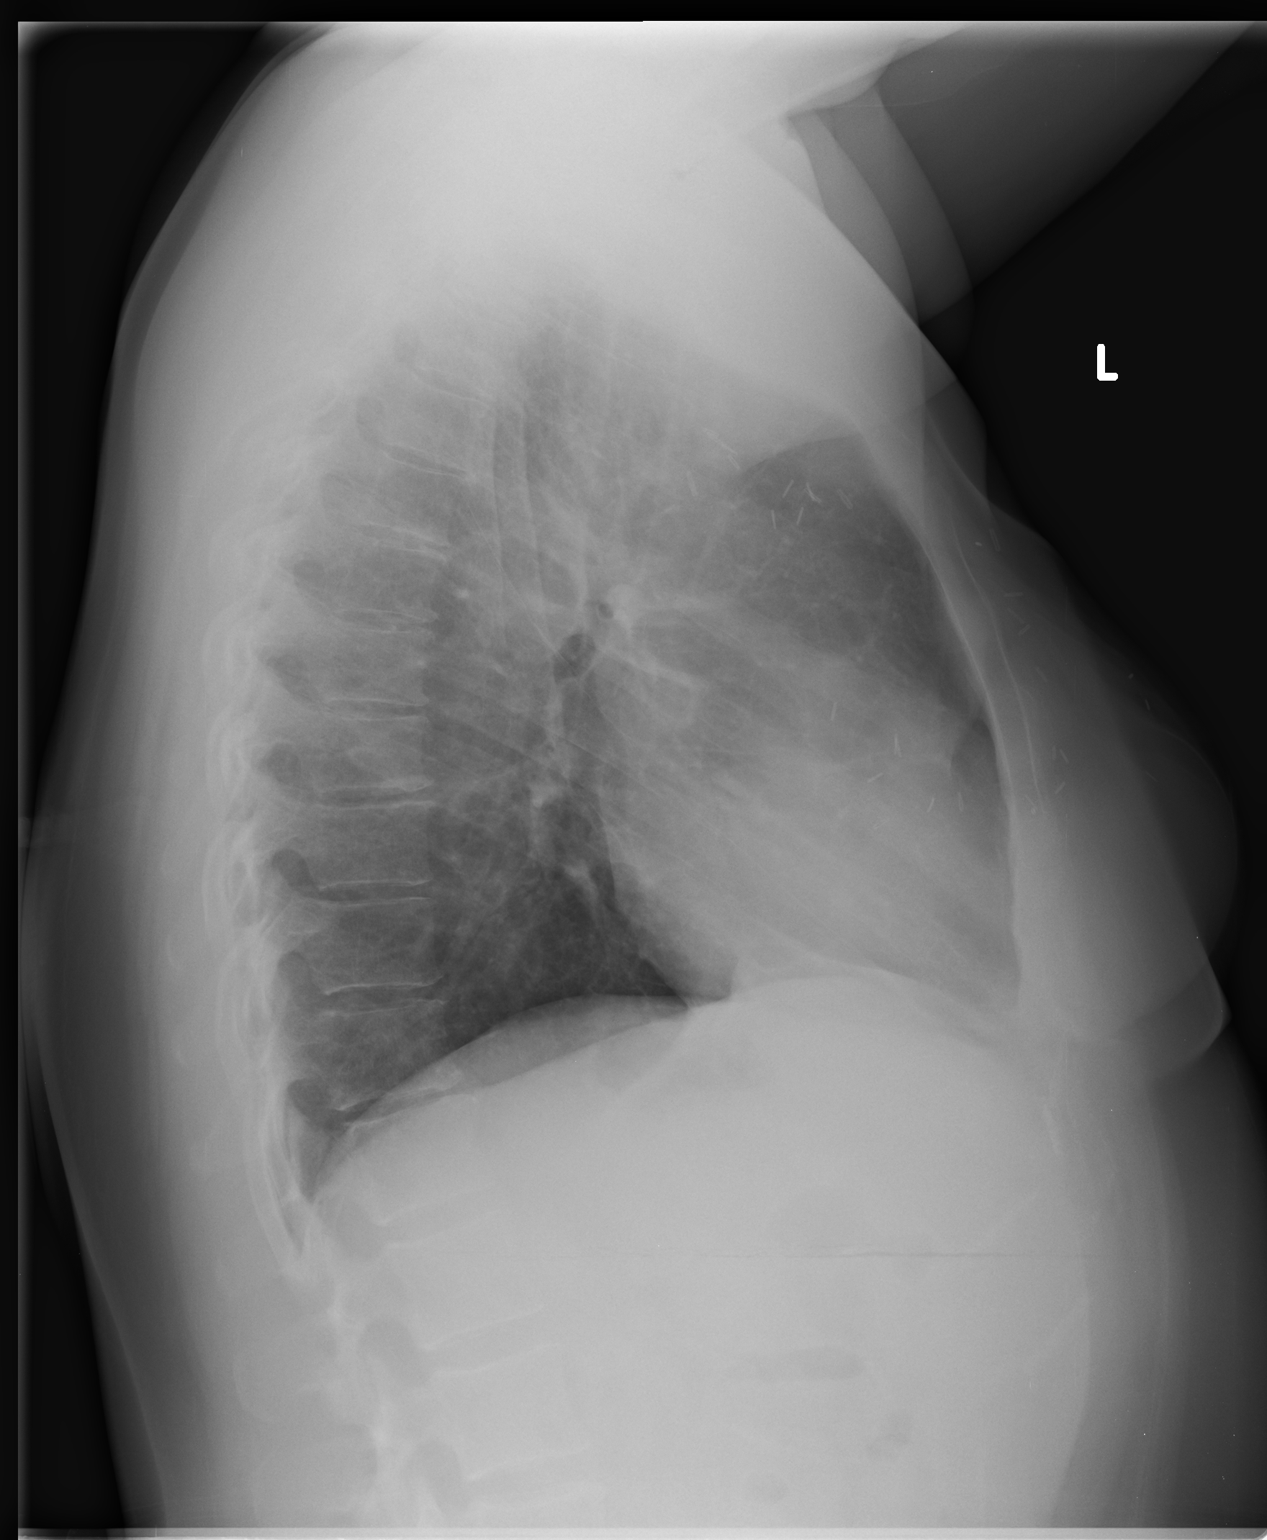

[2 of 2 positions shown; findings below may reference images not displayed]

11/23/18

DIAGNOSTIC STUDIES

EXAM
RADIOLOGICAL EXAMINATION, CHEST; 2 VIEWS FRONTAL AND LATERAL CPT 02656, right ribs:

INDICATION
rib pain after fall
FELL AT WORK [REDACTED]. PAIN RT CHEST RIBS LATERALLY UNDER AXIALLA. LM

TECHNIQUE
Frontal and lateral views of the chest were performed.

COMPARISONS
No priors available for comparison.

FINDINGS
[The heart appears normal in size. There is mild right hilar prominence. Mild peribronchial
thickening. No dense consolidation. Possible breast reconstruction. No gross pneumothorax peer no
acute displaced rib fracture.

IMPRESSION
No acute rib fracture. No gross pneumothorax.

Tech Notes:

FELL AT WORK [REDACTED]. PAIN RT CHEST RIBS LATERALLY UNDER AXIALLA. LM

## 2020-08-18 IMAGING — CR CHEST
3 series · 3 of 3 positions shown · non-contrast
Comparison: none

[rib upper]
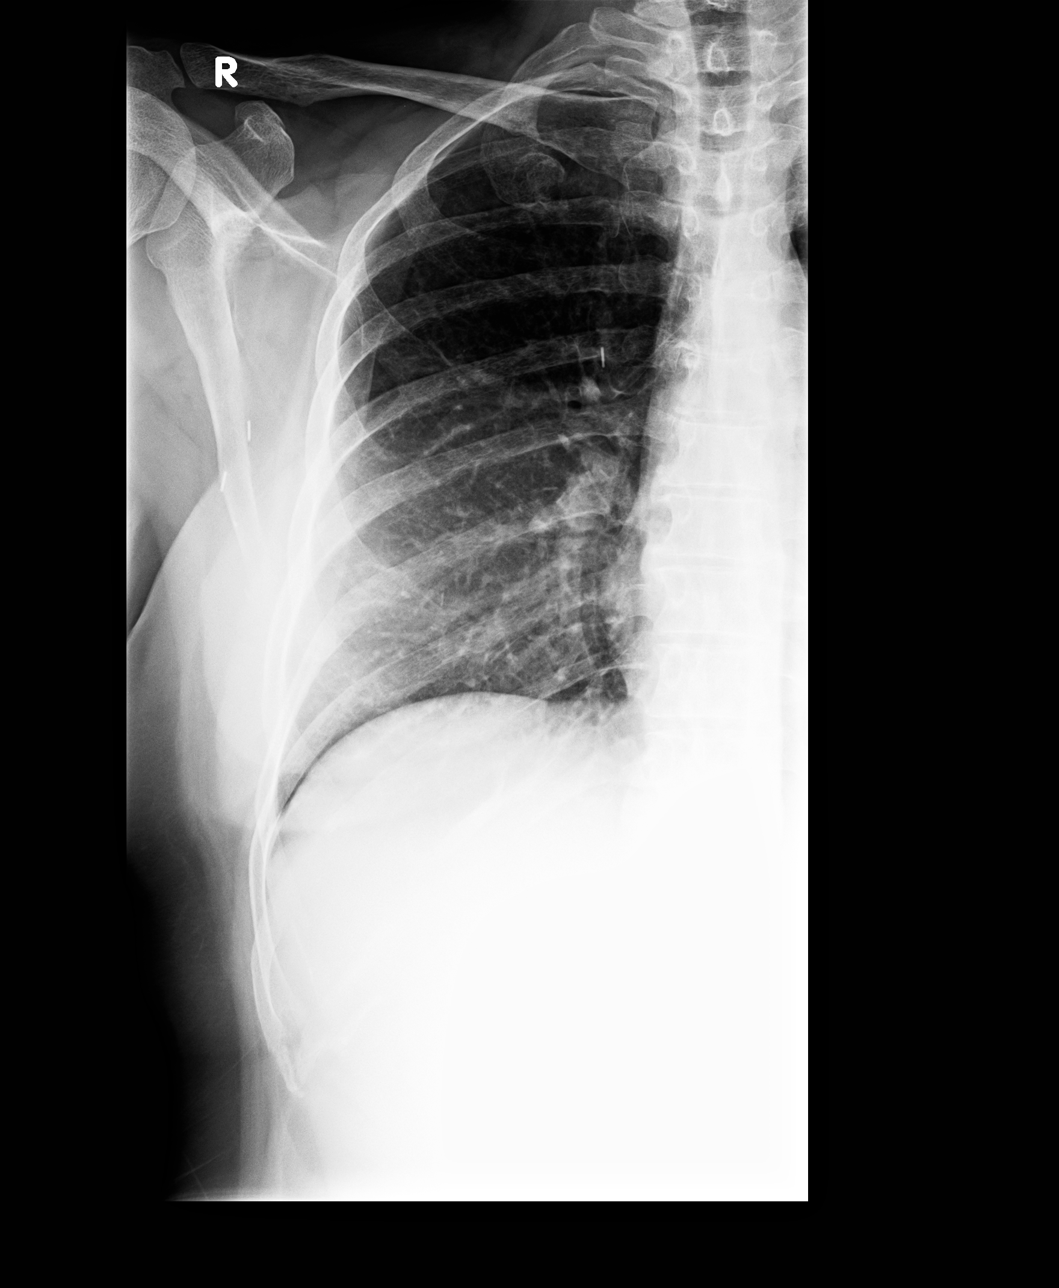

[rib upper obl]
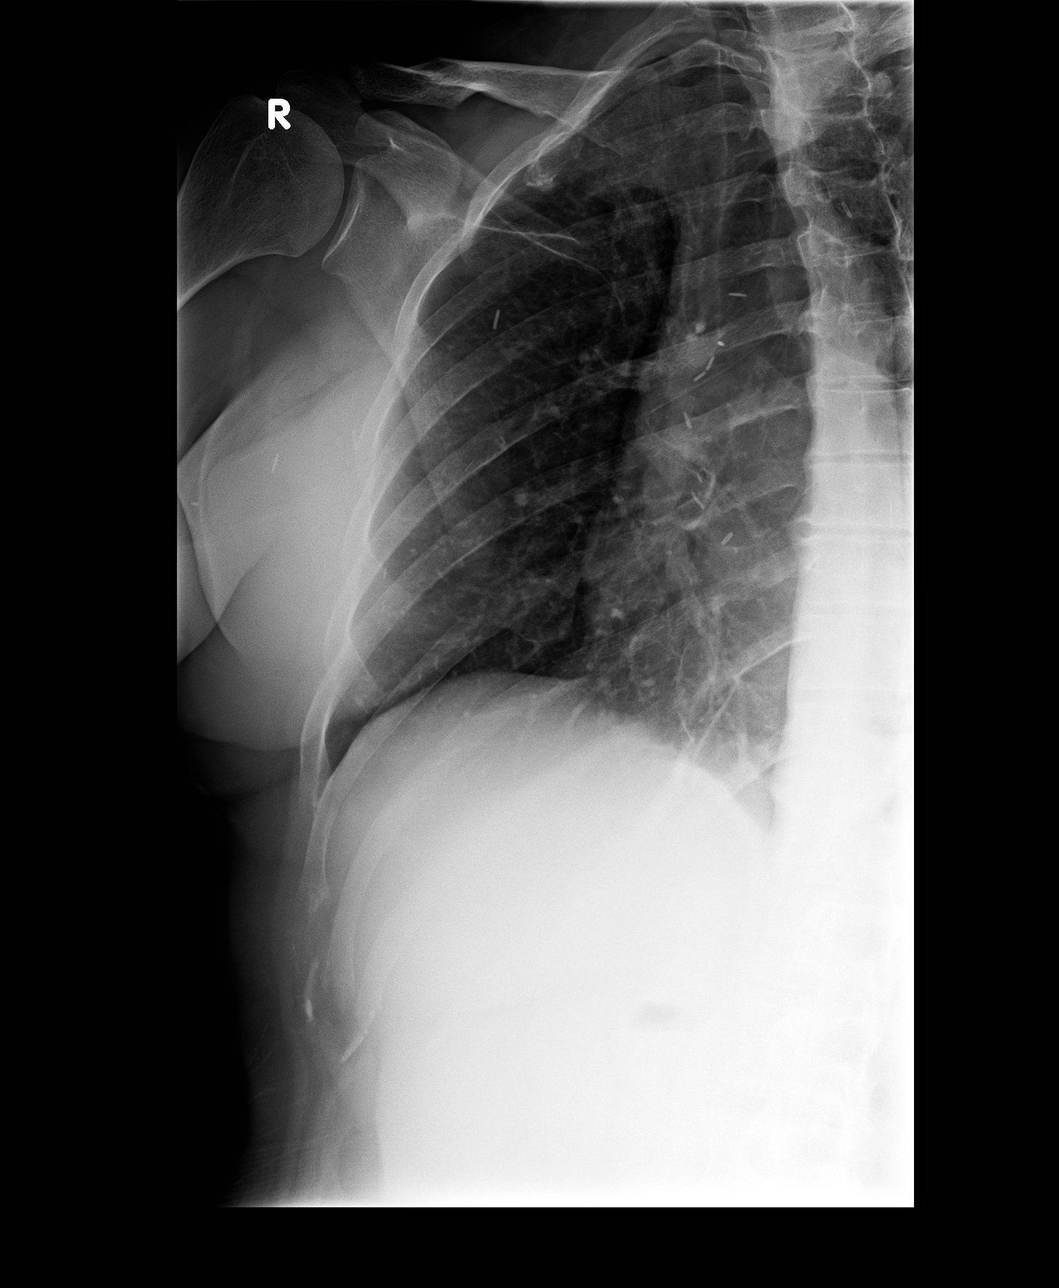

[rib lower]
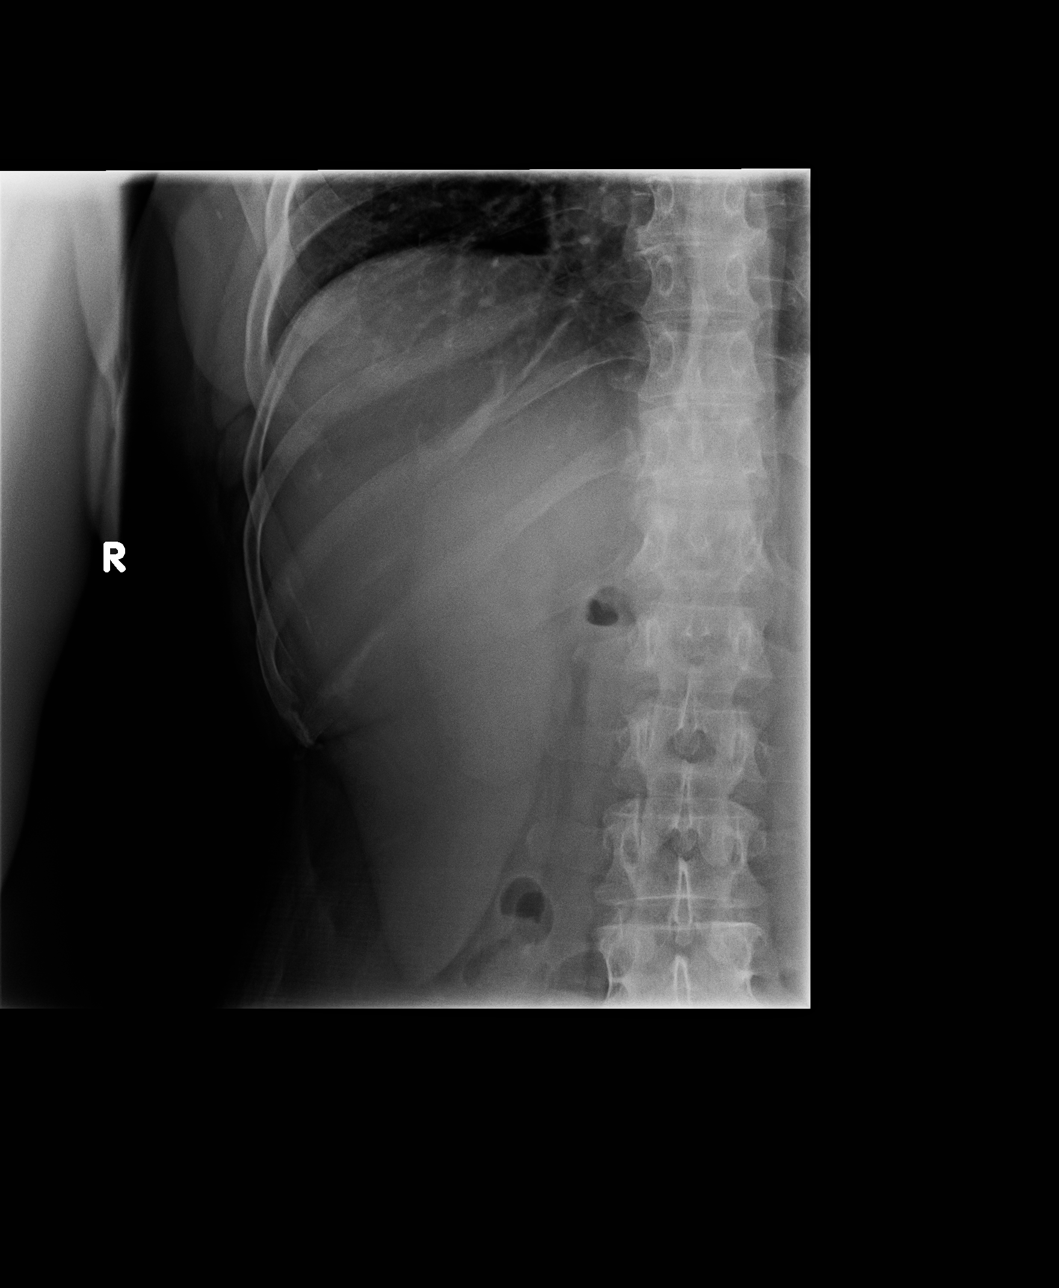

[3 of 3 positions shown; findings below may reference images not displayed]

11/23/18

DIAGNOSTIC STUDIES

EXAM
RADIOLOGICAL EXAMINATION, CHEST; 2 VIEWS FRONTAL AND LATERAL CPT 02656, right ribs:

INDICATION
rib pain after fall
FELL AT WORK [REDACTED]. PAIN RT CHEST RIBS LATERALLY UNDER AXIALLA. LM

TECHNIQUE
Frontal and lateral views of the chest were performed.

COMPARISONS
No priors available for comparison.

FINDINGS
[The heart appears normal in size. There is mild right hilar prominence. Mild peribronchial
thickening. No dense consolidation. Possible breast reconstruction. No gross pneumothorax peer no
acute displaced rib fracture.

IMPRESSION
No acute rib fracture. No gross pneumothorax.

Tech Notes:

FELL AT WORK [REDACTED]. PAIN RT CHEST RIBS LATERALLY UNDER AXIALLA. LM

## 2021-03-09 ENCOUNTER — Encounter: Admit: 2021-03-09 | Discharge: 2021-03-09 | Payer: 59

## 2021-03-10 ENCOUNTER — Encounter: Admit: 2021-03-10 | Discharge: 2021-03-10 | Payer: 59

## 2021-06-05 ENCOUNTER — Encounter: Admit: 2021-06-05 | Discharge: 2021-06-05 | Payer: 59

## 2021-06-05 DIAGNOSIS — N939 Abnormal uterine and vaginal bleeding, unspecified: Secondary | ICD-10-CM

## 2021-06-05 DIAGNOSIS — C50919 Malignant neoplasm of unspecified site of unspecified female breast: Secondary | ICD-10-CM

## 2021-06-05 NOTE — Progress Notes
Patient reported of heavy vaginal bleeding since yesterday . Her LMP was 9 months ago. She had seen OB GYN outside of [place] but the patient would like to have her work up here from our office.   I discussed her symptoms with provider and advised to do E2 and FSH and TVUS as soon as able.   Patient was asking if she needs to go to ER for her heavy vaginal bleeding. I advised patient that we can do the work up as an outpatient unless she has severe HA, dizziness and feel like she is going to pass out with heavy bleeding then she should head to ER immediately for further evaluation.   She agreed with the plan.

## 2021-06-06 ENCOUNTER — Encounter: Admit: 2021-06-06 | Discharge: 2021-06-06 | Payer: 59

## 2021-06-06 NOTE — Telephone Encounter
-----   Message from Benancio Deeds, RN sent at 06/05/2021 10:35 AM CST -----  Please assist patient with lab ( E2 and [facility]) and TVUS.   She will need an appointment wit provider ( either with Dr.Khan or Colletta [place] ) to go over the results.   She would like to have an appts asap.   Thanks [employer]

## 2021-06-06 NOTE — Telephone Encounter
Called pt on 12/6 to get pt scheduled for labs and return visit wit khan. Pt let me know she was going to get labs done the same day as he scans but she is scheduled for return visit and is aware of apt.

## 2021-06-08 ENCOUNTER — Ambulatory Visit: Admit: 2021-06-08 | Discharge: 2021-06-08 | Payer: 59

## 2021-06-08 ENCOUNTER — Encounter: Admit: 2021-06-08 | Discharge: 2021-06-08 | Payer: 59

## 2021-06-08 DIAGNOSIS — C50919 Malignant neoplasm of unspecified site of unspecified female breast: Secondary | ICD-10-CM

## 2021-06-08 DIAGNOSIS — N939 Abnormal uterine and vaginal bleeding, unspecified: Secondary | ICD-10-CM

## 2021-06-08 LAB — FOLLICLE STIMULATING HORMONE: FSH: 28 mU/mL

## 2021-06-08 LAB — ESTRADIOL (E2): ESTRADIOL: <20 pg/mL

## 2021-06-12 ENCOUNTER — Encounter: Admit: 2021-06-12 | Discharge: 2021-06-12 | Payer: 59

## 2021-06-12 NOTE — Telephone Encounter
Reason for appointment Vaginal bleeding  Referral Info: Internal/Urgent - Lyndel Pleasure, MD ([facility] EXM/PROC RM  Important Considerations:    Per referral, Not on any medications/treatments for hx of breast cancer. Took tamoxifen for less than 1 year in 2018.  Having intermittent vaginal bleeding at this time.   Available Records:    Appt notes   Labs   [place] (06/08/21)  o 1. Uterus is normal in size. The endometrium is poorly delineated due to uterine orientation, though measures approximately 0.8 cm. This is within normal limits for a perimenopausal patient. 2. No adnexal mass or pelvic free fluid  Records can be found in: O2  Are additional records needed? Yes     If so, what?   WWE   Pap smear  Care Coordinator plan:    Call patient      Patient reports pink tinged bleeding occurring on 12/3. She denies additional bleeding episodes since then including today. Patient requests to schedule appointment after the holidays. I offered an appointment on 1/17 at [facility] and patient is agreeable. I instructed patient to calls [place] back with any change in symptoms before her appointment. Robyne Askew, RN    Future Appointments   Date Time Provider [place]   07/18/2021  2:00 PM Tessa Lerner, MD [facility]

## 2021-06-23 ENCOUNTER — Emergency Department: Admit: 2021-06-23 | Discharge: 2021-06-23 | Disposition: A | Discharge status: Home / Self Care | Payer: 59

## 2021-06-23 ENCOUNTER — Encounter: Admit: 2021-06-23 | Discharge: 2021-06-23 | Payer: 59

## 2021-06-23 DIAGNOSIS — C50919 Malignant neoplasm of unspecified site of unspecified female breast: Secondary | ICD-10-CM

## 2021-06-23 DIAGNOSIS — T699XXA Effect of reduced temperature, unspecified, initial encounter: Secondary | ICD-10-CM

## 2021-06-23 DIAGNOSIS — F32A Depression: Secondary | ICD-10-CM

## 2021-06-23 DIAGNOSIS — F4310 Post-traumatic stress disorder, unspecified: Secondary | ICD-10-CM

## 2021-06-23 DIAGNOSIS — K620 Anal polyp: Secondary | ICD-10-CM

## 2021-06-23 DIAGNOSIS — K5792 Diverticulitis of intestine, part unspecified, without perforation or abscess without bleeding: Secondary | ICD-10-CM

## 2021-06-23 DIAGNOSIS — M797 Fibromyalgia: Secondary | ICD-10-CM

## 2021-06-23 DIAGNOSIS — M1990 Unspecified osteoarthritis, unspecified site: Secondary | ICD-10-CM

## 2021-06-23 DIAGNOSIS — K635 Polyp of colon: Secondary | ICD-10-CM

## 2021-06-23 DIAGNOSIS — Z789 Other specified health status: Secondary | ICD-10-CM

## 2021-06-23 MED ORDER — IBUPROFEN 600 MG PO TAB
600 mg | Freq: Once | ORAL | 0 refills | Status: DC
Start: 2021-06-23 — End: 2021-06-23

## 2021-06-23 NOTE — ED Notes
Discharge and follow up instructions provided to patient. Pt verbalizes understanding and has no further questions at this time.VSS upon discharge. Breathing even and unlabored. NAD noted. Pt ambulated out of ED with steady gait and all belongings.

## 2021-06-23 NOTE — ED Notes
Pt A/Ox4 and given discharge and follow up instructions. Pt verbalizes understanding and has no further questions or concerns. VSS. Pt ambulated out of ED w/ steady gait to go home by POV.

## 2021-06-23 NOTE — ED Provider Notes
Molly Randolph is a 50 y.o. female.    Chief Complaint:  Chief Complaint   Patient presents with   ? Frostbite       History of Present Illness:  Patient is a 51 year old female with significant past medical history of breast cancer s/p bilateral mastectomy who last received chemotherapy in 2019 and is currently remission, fibromyalgia and depression who presents to the emergency department today for cold exposure.  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  Patient states that she has worked in the cold for many wonders but this is the first time that she is ever experienced this kind of pain in her fingers while delivering mail.  Patient states that she is not able to deliver mail with regular gloves because she needs to have the dexterity of having gloves with the fingers cut off.  Patient states that she was delivering mail from approximately 1230 to 2000 hours yesterday, 12/22.  Patient states that shortly after her shift she began to have pain in her fingers and when she went to heat them up in front of the events in her car they began to burn significantly.  Patient states that she last took Motrin yesterday evening but this did not provide any significant relief.  Of note, patient states that she recently saw her oncologist for routine follow-up appointment for active surveillance and told him that she had some vaginal bleeding.  On chart review, she is scheduled to see OB/GYN on 1/17.  Patient states that she also feels slightly dehydrated due to the fact that she cannot drink very much water when she is working because I will have to go to the bathroom too much.  Patient states that she is having a mild headache, muscle cramping, diarrhea and abdominal bloating.  Patient states that she had diarrhea yesterday. Patient states that the abdominal bloating has been going on for years at this point has been worked up and there has been no etiology at this point.    Review of Systems:  Review of Systems   Constitutional: Negative for chills and fever.   HENT: Negative for sore throat.    Eyes: Negative for visual disturbance.   Respiratory: Negative for cough and shortness of breath.    Cardiovascular: Negative for chest pain and leg swelling.   Gastrointestinal: Positive for abdominal distention and diarrhea. Negative for abdominal pain, blood in stool, nausea and vomiting.   Genitourinary: Negative for difficulty urinating, dysuria, frequency, hematuria and vaginal bleeding.   Musculoskeletal: Positive for arthralgias. Negative for back pain.   Skin: Negative for rash.   Neurological: Positive for headaches. Negative for dizziness.       Allergies:  Patient has no known allergies.    Past Medical History:  Medical History:   Diagnosis Date   ? Anal polyp    ? Arthritis     Lower back.   ? Breast cancer (HCC) 07/05/2014    right ILC   ? Breast cancer (HCC) 2018    Left Breast    ? Colon polyps    ? Depression    ? Diverticulitis    ? Fibromyalgia    ? Limb alert care status     Right Arm   ? PTSD (post-traumatic stress disorder)        Past Surgical History:  Surgical History:   Procedure Laterality Date   ? BREAST BIOPSY Right 07/05/2014   ?  HX MASTECTOMY Right 08/04/2014   ? MASTECTOMY WITH SENTINEL NODE BIOPSY Right 08/04/2014    Performed by Cordelia Poche, MD at [facility] OR   ? INSERTION TISSUE EXPANDER BREAST Right 08/04/2014    Performed by Marlinda Mike, MD at [facility] OR   ? HX BREAST RECONSTRUCTION Right 09/16/2014   ? RECONSTRUCTION BREAST WITH IMPLANT Right 09/16/2014    Performed by Marlinda Mike, MD at [facility] OR   ? REMOVAL TISSUE EXPANDER Right 09/16/2014    Performed by Marlinda Mike, MD at [facility] OR   ? MASTOPEXY LEFT BREAST FOR SYMMETRY, FAT GRAFTING TO LEFT AND RIGHT BREAST Bilateral 10/14/2015    Performed by Marlinda Mike, MD at [facility] OR   ? Left Skin Sparing Mastectomy, Intra-Operative Lymphatic Mapping, Sentinel Lymph Node Biopsy Left 10/08/2016    Performed by Cordelia Poche, MD at [facility] OR   ? INSERTION TISSUE EXPANDER BREAST WITH ALLODERM Left 10/08/2016    Performed by Marlinda Mike, MD at [facility] OR   ? INSERTION VENOUS ACCESS PORT Right 10/08/2016    Performed by Janine Ores, DO at [facility] OR   ? COLONOSCOPY DIAGNOSTIC WITH SPECIMEN COLLECTION BY BRUSHING/ WASHING - FLEXIBLE N/A 01/31/2018    Performed by Bernita Buffy, MD at [facility] OR   ? REPLACEMENT TISSUE EXPANDER WITH PERMANENT PROSTHESIS Left 07/16/2018    Performed by Marlinda Mike, MD at [facility] OR   ? OPEN PERIPROSTHETIC CAPSULOTOMY BREAST Left 07/16/2018    Performed by Marlinda Mike, MD at [facility] OR   ? REVISION RECONSTRUCTED BREAST Right 07/16/2018    Performed by Marlinda Mike, MD at [facility] OR   ? REMOVAL TUNNELED CENTRAL VENOUS ACCESS DEVICE INCLUDING PORT/ PUMP Right 07/16/2018    Performed by Marlinda Mike, MD at [facility] OR   ? REMOVAL INTACT MAMMARY IMPLANT Left 09/30/2018    Performed by Marlinda Mike, MD at [facility] OR   ? ENDOMETRIAL ABLATION     ? HX SINUS SURGERY     ? LYMPH NODE BIOPSY      left neck   ? MOUTH SURGERY     ? TONSILLECTOMY     ? TUBAL LIGATION         Pertinent medical/surgical history reviewed    Social History:  Social History     Tobacco Use   ? Smoking status: Heavy Smoker     Packs/day: 0.75     Years: 29.00     Pack years: 21.75     Types: Cigarettes   ? Smokeless tobacco: Former   ? Tobacco comments:     1 pack a day    Vaping Use   ? Vaping Use: Former   ? Substances: Nicotine   Substance Use Topics   ? Alcohol use: No     Alcohol/week: 0.0 standard drinks   ? Drug use: Not Currently     Types: Marijuana     Comment: 3x per month     Social History     Substance and Sexual Activity   Drug Use Not Currently   ? Types: Marijuana    Comment: 3x per month             Family History:  Family History   Problem Relation Age of Onset   ? Cancer-Breast Mother 60   ? Cancer-Colon Mother 67   ? Cancer-Breast Maternal Aunt 72   ? Cancer Maternal Grandmother  uterine       Vitals:  ED Vitals    Date and Time T BP P RR SPO2P SPO2 User   06/23/21 0630 -- 146/80 -- -- 78 95 % MA   06/23/21 0600 -- 148/93 -- -- 85 95 % MA   06/23/21 0546 36.4 ?C (97.6 ?F) 153/93 -- 16 PER MINUTE 83 95 % MA          Physical Exam:  Physical Exam  Vitals and nursing note reviewed.   Constitutional:       Appearance: Normal appearance.   HENT:      Head: Normocephalic and atraumatic.      Mouth/Throat:      Mouth: Mucous membranes are moist.   Eyes:      Extraocular Movements: Extraocular movements intact.   Cardiovascular:      Rate and Rhythm: Normal rate and regular rhythm.      Heart sounds: Normal heart sounds.   Pulmonary:      Effort: Pulmonary effort is normal.      Breath sounds: Normal breath sounds.   Abdominal:      General: Abdomen is flat.      Palpations: Abdomen is soft.      Tenderness: There is no abdominal tenderness. There is no guarding.   Musculoskeletal:         General: Normal range of motion.      Cervical back: Normal range of motion and neck supple.   Skin:     General: Skin is warm and dry.      Comments: Patient has normal sensation in her fingers.  On exam today there does not appear to be any swelling, erythema or skin breakdown of the fingers.   Neurological:      Mental Status: She is alert and oriented to person, place, and time.   Psychiatric:         Mood and Affect: Mood normal.         Behavior: Behavior normal.         Thought Content: Thought content normal.         Judgment: Judgment normal.         Laboratory Results:  Labs Reviewed - No data to display       Radiology Interpretation:        EKG:      Medical Decision Making:  Molly Randolph is a 50 y.o. female who presents with chief complaint as listed above. Based on the history and presentation, the list of differential diagnoses considered included, but was not limited to, hypothermia and frostbite    ED Course  Patient was seen and evaluated by resident and attending physician.  Based on history and physical exam, was determined the patient did not have any significant sequela that would require imaging or laboratory work at this point.  Patient was deemed to have mild degree of cold exposure to her distal fingers without any swelling or erythema.  Patient was educated on safe practices of working in the cold such as wearing gloves that fully cover her fingers and given patient education handout on frostbite.  Patient was given education on supportive care such as taking NSAIDs for pain.  Patient was educated that she should avoid any reexposure to the cold to avoid any chance of refreezing her extremities which could lead to worsening symptoms.  Patient was given a work note to return to work on 06/26/2021.      Upon  serial reexamination, patient continued to be hemodynamically stable and no apparent acute distress.  Patient was updated on plan and discussed the need for follow-up with her PCP if she continues to have any sequela that require her to miss anymore work.  Patient received understanding was in agreement with this plan.  Patient was given strict return precautions and discharged in stable condition.       Complexity of Problems Addressed  Patient's active diagnoses as well as contributing pre-existing medical problems include:  Clinical Impression   Cold exposure, initial encounter     Evaluation performed for potential threat to life or bodily function during this visit given the initial differential diagnosis and clinical impression(s) as discussed previously in MDM/ED course.    Additional data reviewed:    ? History was obtained from an independent historian: Not in addition to what is mentioned above  ? Prior non-ED notes reviewed: Not in addition to what is mentioned above  ? Independent interpretation of diagnostic tests was performed by me: Not in addition to what is mentioned above  ? Patient presentation/management was discussed with the following qualified health care professionals and/or other relevant professionals: Not in addition to what is mentioned above    Risk evaluation:    ? Diagnosis or treatment of patient condition impacted by social determinant of health: None  ? Tests Considered but not performed due to clinical scoring (if not mentioned in ED course, aside from what is implied by clinical scores listed): Initial diagnosis of hypothermia was considered with a chief complaint of cold exposure, but upon initial history and exam patient was determined not to be at any risk for severe frostbite and further work-up was deferred at this time.  ? Rationale regarding whether admission or escalation of care considered if not performed (if not mentioned in ED course, aside from what is implied by clinical scores listed): As noted above    ED Scoring:                                Coding    Facility Administered Meds:  Medications - No data to display    Clinical Impression:  Clinical Impression   Cold exposure, initial encounter       Disposition/Follow up  ED Disposition     ED Disposition   Discharge           Winters, [place], DO  [address]  [place] [address]  [phone]    Call   As needed for cold exposure    Obstetrics and Gynecology: [facility]  [address]  [address]  [place] [address]  [phone]  Go on 07/18/2021        Medications:  New Prescriptions    No medications on file       Procedure Notes:  Procedures          Lenis Dickinson, MD

## 2021-07-14 ENCOUNTER — Encounter: Admit: 2021-07-14 | Discharge: 2021-07-14 | Payer: 59

## 2021-07-15 ENCOUNTER — Emergency Department: Admit: 2021-07-15 | Discharge: 2021-07-15 | Payer: 59

## 2021-07-15 ENCOUNTER — Emergency Department: Admit: 2021-07-15 | Discharge: 2021-07-14 | Payer: 59

## 2021-07-15 MED ADMIN — IBUPROFEN 200 MG PO TAB [3841]: 400 mg | ORAL | @ 07:00:00 | Stop: 2021-07-15 | NDC 00904791461

## 2021-07-15 NOTE — ED Notes
Pt is a 51 y.o. female presenting to ED FTA6 via triage with complaints of strep throat. Pt went to urgent care & was dx with strep throat on Tuesday & was prescribed penicillin. She has been taking the penicillin, but states her symptoms have not improved. She endorses weakness, body aches, runny nose with green/bloody mucus, & her throat feels like "there is knives in it." She denies any sob/cp, fever/chills, or n/v/d. She also endorses a rash on her face from the penicillin. Patient a&o x 4. Eupneic on RA.  Skin p/w/d. Pt placed on BP/SpO2 monitors. Pt is currently resting on cart, wheels are locked, cart in lowest position. Side rails raised x2, call light within reach. Awaiting MD eval.     Medical History:   Diagnosis Date    Anal polyp     Arthritis     Lower back.    Breast cancer ([place]) 07/05/2014    right ILC    Breast cancer ([place]) 2018    Left Breast     Colon polyps     Depression     Diverticulitis     Fibromyalgia     Limb alert care status     Right Arm    PTSD (post-traumatic stress disorder)        Belongings:

## 2021-07-18 ENCOUNTER — Encounter: Admit: 2021-07-18 | Discharge: 2021-07-18 | Payer: 59

## 2021-07-28 ENCOUNTER — Encounter: Admit: 2021-07-28 | Discharge: 2021-07-28 | Payer: 59

## 2021-08-28 ENCOUNTER — Encounter: Admit: 2021-08-28 | Discharge: 2021-08-28 | Payer: 59

## 2021-08-29 ENCOUNTER — Emergency Department: Admit: 2021-08-29 | Discharge: 2021-08-28 | Payer: 59

## 2021-08-29 ENCOUNTER — Emergency Department: Admit: 2021-08-29 | Discharge: 2021-08-29 | Payer: 59

## 2021-08-29 MED ORDER — GABAPENTIN 100 MG PO CAP
100 mg | Freq: Once | ORAL | 0 refills | Status: AC
Start: 2021-08-29 — End: ?
  Administered 2021-08-29: 06:00:00 100 mg via ORAL

## 2021-08-29 MED ORDER — KETOROLAC 30 MG/ML (1 ML) IJ SOLN
30 mg | Freq: Once | INTRAMUSCULAR | 0 refills | Status: AC
Start: 2021-08-29 — End: ?
  Administered 2021-08-29: 06:00:00 30 mg via INTRAMUSCULAR

## 2021-08-29 MED ORDER — LIDOCAINE 4 % TP CREA
Freq: Once | TOPICAL | 0 refills | Status: AC
Start: 2021-08-29 — End: ?
  Administered 2021-08-29: 06:00:00 via TOPICAL

## 2021-10-25 ENCOUNTER — Encounter: Admit: 2021-10-25 | Discharge: 2021-10-25 | Payer: 59

## 2021-10-25 NOTE — Telephone Encounter
Returned pt vm- pt states she has new should pain and had xray in feb that she was told was possible bone cancer- read report with patient and informed her there is no indication on xray of this per the radiologist impression- pt missed 6 month appt and has not been seen in clinic in `1 year.  Pt would like appt to rule out shoulder pain and hip pain as her FMLA at her job is requiring this.

## 2021-10-27 ENCOUNTER — Encounter: Admit: 2021-10-27 | Discharge: 2021-10-27 | Payer: 59

## 2021-10-27 DIAGNOSIS — K5792 Diverticulitis of intestine, part unspecified, without perforation or abscess without bleeding: Secondary | ICD-10-CM

## 2021-10-27 DIAGNOSIS — M797 Fibromyalgia: Secondary | ICD-10-CM

## 2021-10-27 DIAGNOSIS — M1990 Unspecified osteoarthritis, unspecified site: Secondary | ICD-10-CM

## 2021-10-27 DIAGNOSIS — K635 Polyp of colon: Secondary | ICD-10-CM

## 2021-10-27 DIAGNOSIS — F4310 Post-traumatic stress disorder, unspecified: Secondary | ICD-10-CM

## 2021-10-27 DIAGNOSIS — K620 Anal polyp: Secondary | ICD-10-CM

## 2021-10-27 DIAGNOSIS — Z789 Other specified health status: Secondary | ICD-10-CM

## 2021-10-27 DIAGNOSIS — F32A Depression: Secondary | ICD-10-CM

## 2021-10-27 DIAGNOSIS — C50919 Malignant neoplasm of unspecified site of unspecified female breast: Secondary | ICD-10-CM

## 2021-10-27 DIAGNOSIS — C50112 Malignant neoplasm of central portion of left female breast: Secondary | ICD-10-CM

## 2021-10-27 DIAGNOSIS — R52 Pain, unspecified: Secondary | ICD-10-CM

## 2021-10-27 NOTE — Progress Notes
Date of Service: 10/27/2021      Subjective:             Reason for Visit:  Breast Cancer and Follow Up      Molly Randolph is a 51 y.o. female.     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 In October 2015, she first noticed a depressed area superior to her left nipple, but thought that the finding was due to the weight loss and change in breast size. A month later, she noticed a mass in the same area. Bilateral diagnostic mammograms 06/07/14 ([place]) showed heterogeneously dense breast tissue. A spiculated mass was seen in the right breast 10-11:00 position, 2 cm FTN, anterior depth. There was an adjacent area architectural distortion seen only on CC tomosynthesis slice 39/64, slightly medial near 12:00, in the anterior central right breast. There were no suspicious masses, calcifications or architectural distortions in the left breast.  Right breast ultrasound 06/07/14 ([place]) showed a bilobed hypoechoic, irregular spiculated mass in the right breast extending from 11-12:00, 2 cm FTN with the dominant portion measuring 1.9 cm and a total inclusive distance of 3.4 cm. Right axillary survey demonstrates no suspicious adenopathy. BIRADS 5. On 07/05/14 she underwent a right breast biopsy that showed ILC, grade I, ER 90%, PR 98%, HER2 1+ IHC, Ki67 3%. On 08/04/14 Dr Nelson Chimes performed a right mastectomy and SLNB. Dr Fran Lowes placed a tissue expanders. Pathology showed ILC, grade 2, measuring 4.7cm.     She was seen on 09/11/16 for a new left breast mass. Left diagnostic mammogram 09/13/16 ([place]) revealed the area palpable concern as identified by the patient at 3:00, approximately 6-7 cm the nipple. At this location there was a subtle oval mass largely obscured by overlying glandular tissue. This measured approximately 1 cm but margins were poorly visualized. Further evaluation with ultrasound was performed. Targeted left breast ultrasound 09/13/16 ([place]) revealed at 3:00, 5 cm from nipple. At this location there was a lobulated hypoechoic mass with ill-defined margins measuring 1.3 cm x 0.8 cm x 1.1 cm. This was moderately suspicious and ultrasound-guided needle core biopsy was recommended. No axillary adenopathy was identified. On 09/14/16 left breast biopsy showed IDC, grade III, ER 0+, PR 0+, HER2 3+ IHC, Ki67 33%. She had left mastectomy and SLNB. Tumor was multifocal (1.7 cm, 0.2 cm, and multiple foci of microinvasion). 1/2 SLN was positive. ER/PR were 0% and HER2 was 3+ by IHC. She completed  Taxotere/Carbo/Herceptin; 02/15/17.  She completed radiation oncology 04/18/17 with Dr Clovis Riley. She stopped Herceptin 08/12/2017. Tamoxifen 20mg  daily started 03/2017. She reports stopping in 2019.     PRESENT THERAPY:  Observation.      She is here today for right elbow and arm pain that she has had since 08/2021.    She was last seen in clinic 08/05/2020     Review of Systems   Musculoskeletal: Positive for arthralgias.        Right shoulder and right arm and bilateral hips. Psychiatric/Behavioral: The patient is nervous/anxious.    All other systems reviewed and are negative.      No Known Allergies    Objective:         ? ALPRAZolam (XANAX) 1 mg tablet Take one tablet by mouth every 4 hours as needed for Anxiety.   ? carisoprodol(+) (SOMA) 350 mg tablet Take one tablet by mouth three times daily.   ? diclofenac sodium (VOLTAREN) 1 % topical gel Apply four g topically to affected area four times daily.   ? HYDROcodone/acetaminophen (NORCO) 5/325 mg tablet Take one tablet by mouth every 6 hours as needed for Pain   ? ibuprofen (ADVIL) 200 mg tablet Take two tablets to four tablets by mouth every 6 hours as needed for Pain. Take with food.   ? lidocaine (LIDODERM) 5 % topical patch Apply one patch topically to affected area every 24 hours. Apply patch for 12 hours, then remove for 12 hours before repeating.   ? methocarbamoL (ROBAXIN) 750 mg tablet Take one tablet by mouth four times daily.       Body mass index is 28.52 kg/m?Marland Kitchen     Pain Score: Five  Pain Loc: Arm (bilateral arms and hips, right shoulder pain, xray from [place])      Pain Addressed:  N/A    Patient Evaluated for a Clinical Trial: No treatment clinical trial available for this patient.     [place] Cooperative Oncology Group performance status is 1, Restricted in physically strenuous activity but ambulatory and able to carry out work of a light or sedentary nature, e.g., light house work, office work.     Physical Exam  Vitals and nursing note reviewed.   Constitutional:       Appearance: She is well-developed.   Pulmonary:      Effort: No respiratory distress.   Chest:   Breasts:     Right: No mass, skin change or tenderness.      Left: No mass, skin change or tenderness.       Lymphadenopathy:      Cervical: No cervical adenopathy.      Upper Body:      Right upper body: No supraclavicular or axillary adenopathy.      Left upper body: No supraclavicular or axillary adenopathy.          RADIOLOGY:  Left axilla ultrasound 03/22/2020: BIRAD 2-benign    Bone scan 08/02/2020:  Mild degenerative uptake seen involving the thoracolumbar spine, bilateral shoulders, sternoclavicular joints and hips. No scintigraphic evidence of osseous metastatic disease.     CT CAP 08/02/2020:   CHEST:   Prior bilateral mastectomies with right implant reconstruction. No thoracic metastatic disease.   ABDOMEN AND PELVIS:   1. ?No abdominopelvic metastatic disease.   2. ?Small left adnexal cystic lesion, which may represent a dominant follicle or functional cyst.       Right shoulder XR 08/29/2021:  1. ?No acute fracture or dislocation is  identified.   2. ?Normal alignment. Glenohumeral and AC joint spaces are maintained.   3. ?Surgical clips overlie the right chest wall. Visualized right lung field is clear. Surrounding soft tissues are unremarkable.     Elbow right XR 08/28/2021:  No acute finding.        Assessment and Plan:    1. Ms Baumhover is a 51 year old post menopausal female with right breast ILC, grade I-II, measuring 4.7cm, ER/PR+ and HER2 negative.   2  Oncotype Dx RS was 10. The risk of 10 year distant recrrence on Tamoxifen is estimated to be 7%. Chemotherapy may provide an additional benefit of about 0% in terms to risk of recurrence. Chemotherapy was not recommended. She was started on Tamoxifen 20 mg 08/2014. She took tamoxifen off an on.   3. New left breast cancer 08/2016. On 09/14/16 left breast biopsy showed IDC, grade III, ER 0+, PR 0+, HER2 3+ IHC, Ki67 33%.  4. Left mastectomy and SLNB for a 1.7 cm cancer. I/2 SLN was positive.   5. Given the size and aggressive biology of the cancer, including HER2 positivity, we have recommended systemic chemotherapy.  We have recommended a treatment regimen of Taxotere, Carboplatin, and Herceptin x6; completed 02/15/17.  We planned to follow by Herceptin to complete a total treatment duration of 52 weeks. She stopped Herceptin 08/12/2017.   6. She was started on Tamoxifen 20mg  daily 03/2017. She reports stopping in 2019.   7. Right elbow and right arm pain; XR negative. We will order bone scan now. If negative then refer to PT.   8. She completed radiation oncology 04/18/17 with Dr Clovis Riley.   9. RTC in one year.         My collaborating MD Dr Raul Del.     For up to date information on the COVID-19 virus, visit the [facility] website. [email or web address]  ? General supportive care during cold and flu season and infection prevention reminders:   o Wash hands often with soap and water for at least 20 seconds  o Cover your mouth and nose  o Social distancing: try to maintain 6 feet between you and other people  o Stay home if sick and symptoms mild or manageable  - If you must be around people wear a mask    ? If you are having symptoms of a lower respiratory infection (cough, shortness of breath) and/or fever AND either traveled in last 30 days (internationally or to region of exposure) OR known exposure to patient with COVID19:    o Call your primary care provider for questions or health needs.   - Tell your doctor about your recent travel and your symptoms    o In a medical emergency, call 911 or go to the nearest emergency room.  Total Time Today was 30 minutes in the following activities: Preparing to see the patient, Obtaining and/or reviewing separately obtained history, Performing a medically appropriate examination and/or evaluation, Counseling and educating the patient/family/caregiver, Ordering medications, tests, or procedures and Documenting clinical information in the electronic or other health record      Myrla Halsted, APRN-NP

## 2022-08-13 ENCOUNTER — Emergency Department: Admit: 2022-08-13 | Discharge: 2022-08-13 | Disposition: A | Discharge status: Home / Self Care | Payer: 59

## 2022-08-13 ENCOUNTER — Encounter: Admit: 2022-08-13 | Discharge: 2022-08-13 | Payer: 59

## 2022-08-13 DIAGNOSIS — M2550 Pain in unspecified joint: Secondary | ICD-10-CM

## 2022-08-13 LAB — COMPREHENSIVE METABOLIC PANEL
ALBUMIN: 4 g/dL (ref 3.5–5.0)
ALK PHOSPHATASE: 58 U/L — ABNORMAL LOW (ref 25–110)
ALT: 15 U/L (ref 7–56)
ANION GAP: 10 K/UL (ref 3–12)
AST: 18 U/L (ref 7–40)
CALCIUM: 9.9 mg/dL (ref 8.5–10.6)
CHLORIDE: 101 MMOL/L (ref 98–110)
CO2: 27 MMOL/L (ref 21–30)
EGFR: >60 mL/min (ref >60)
SODIUM: 138 MMOL/L (ref 137–147)
TOTAL PROTEIN: 7.2 g/dL (ref 6.0–8.0)

## 2022-08-13 LAB — CBC AND DIFF
ABSOLUTE BASO COUNT: 0 K/UL (ref 0–0.20)
ABSOLUTE EOS COUNT: 0.3 K/UL (ref 0–0.45)
ABSOLUTE MONO COUNT: 0.4 K/UL (ref 0–0.80)
MCHC: 34 g/dL (ref 32.0–36.0)
MDW (MONOCYTE DISTRIBUTION WIDTH): 17 (ref <20.7)
MPV: 7.3 FL (ref 7–11)
WBC COUNT: 6.4 K/UL (ref 4.5–11.0)

## 2022-08-13 LAB — MAGNESIUM: MAGNESIUM: 1.3 mg/dL — ABNORMAL LOW (ref 1.6–2.6)

## 2022-08-13 LAB — COVID-19 (SARS-COV-2) PCR

## 2022-08-13 LAB — INFLUENZA A/B AND RSV PCR: FLU A: NEGATIVE mg/dL (ref 7–25)

## 2022-08-13 MED ORDER — ACETAMINOPHEN 500 MG PO TAB
1000 mg | Freq: Once | ORAL | 0 refills | Status: CP
Start: 2022-08-13 — End: ?
  Administered 2022-08-13: 17:00:00 1000 mg via ORAL

## 2022-08-13 MED ORDER — CARISOPRODOL 350 MG PO TAB
350 mg | ORAL_TABLET | ORAL | 0 refills | Status: DC | PRN
Start: 2022-08-13 — End: 2022-08-13

## 2022-08-13 MED ORDER — MAGNESIUM SULFATE IN D5W 1 GRAM/100 ML IV PGBK
1 g | Freq: Once | INTRAVENOUS | 0 refills | Status: CP
Start: 2022-08-13 — End: ?
  Administered 2022-08-13: 18:00:00 1 g via INTRAVENOUS

## 2022-08-13 MED ORDER — KETOROLAC 15 MG/ML IJ SOLN
15 mg | Freq: Once | INTRAVENOUS | 0 refills | Status: CP
Start: 2022-08-13 — End: ?
  Administered 2022-08-13: 17:00:00 15 mg via INTRAVENOUS

## 2022-08-13 MED ORDER — METHOCARBAMOL 750 MG PO TAB
750 mg | Freq: Once | ORAL | 0 refills | Status: CP
Start: 2022-08-13 — End: ?
  Administered 2022-08-13: 18:00:00 750 mg via ORAL

## 2022-08-13 MED ORDER — CARISOPRODOL 350 MG PO TAB
350 mg | ORAL_TABLET | ORAL | 0 refills | Status: AC | PRN
Start: 2022-08-13 — End: ?

## 2022-08-13 NOTE — ED Notes
Pt A&Ox4 and given discharge instructions and follow up information. Pt verbalizes understanding and has no further questions or complaints. VSS. Pt ambulated out of ED w/ steady gait to go home by POV.

## 2022-08-13 NOTE — ED Notes
52 y.o female presents to ed for cc of generalized joint pain x1 week worsening on ambulation. Denies trauma, falls, or fevers. PMH includes cancer - currently in remission. Denies relieving factors and reports pain at a throbbing 10/10. Pt A&Ox4 resting in bed, respirations even and non-labored. Skin is warm, dry, intact and appropriate for ethnicity. Pt hooked up to monitors. Bed is in lowest locked position, call light within reach.

## 2022-11-19 ENCOUNTER — Encounter: Admit: 2022-11-19 | Discharge: 2022-11-19 | Payer: 59

## 2022-11-19 NOTE — Telephone Encounter
Patient reports she noticed a lump on her right breast about a week ago. She is not sure if it her implant or a lump.Patient unable to tell if spot is red or warm to the touch.  It feels hot because I am working. Spot is not tender to the touch. Patient had a fever on Saturday of 100.7 F. Currently afebrile.     Patient is also concern about not being able to swallow and food getting stocked in her throat. This has been going on for 6 months. Patient has not reached out to PCP. She prefers to see our team. Explained to patient she might have to see another specialist such an ENT and have a swallow study if having these symptoms. Patient denies any trouble breathing or shortness of breath.     Appointment scheduled with S. Lafaver, APRN on 11/21/2022 at 9 am at [facility]. Patient to go to the ED if any trouble breathing or shortness of breath. Patient voiced understanding and agreed with plan of care. KBolanos, RN

## 2022-11-21 ENCOUNTER — Encounter: Admit: 2022-11-21 | Discharge: 2022-11-21 | Payer: 59

## 2022-11-21 DIAGNOSIS — K635 Polyp of colon: Secondary | ICD-10-CM

## 2022-11-21 DIAGNOSIS — K620 Anal polyp: Secondary | ICD-10-CM

## 2022-11-21 DIAGNOSIS — C50919 Malignant neoplasm of unspecified site of unspecified female breast: Secondary | ICD-10-CM

## 2022-11-21 DIAGNOSIS — R8290 Unspecified abnormal findings in urine: Secondary | ICD-10-CM

## 2022-11-21 DIAGNOSIS — C50812 Malignant neoplasm of overlapping sites of left female breast: Secondary | ICD-10-CM

## 2022-11-21 DIAGNOSIS — F32A Depression: Secondary | ICD-10-CM

## 2022-11-21 DIAGNOSIS — K5792 Diverticulitis of intestine, part unspecified, without perforation or abscess without bleeding: Secondary | ICD-10-CM

## 2022-11-21 DIAGNOSIS — N644 Mastodynia: Secondary | ICD-10-CM

## 2022-11-21 DIAGNOSIS — F4310 Post-traumatic stress disorder, unspecified: Secondary | ICD-10-CM

## 2022-11-21 DIAGNOSIS — M797 Fibromyalgia: Secondary | ICD-10-CM

## 2022-11-21 DIAGNOSIS — M1990 Unspecified osteoarthritis, unspecified site: Secondary | ICD-10-CM

## 2022-11-21 DIAGNOSIS — R1310 Dysphagia, unspecified: Secondary | ICD-10-CM

## 2022-11-21 DIAGNOSIS — R5383 Other fatigue: Secondary | ICD-10-CM

## 2022-11-21 DIAGNOSIS — Z789 Other specified health status: Secondary | ICD-10-CM

## 2022-11-21 LAB — CBC AND DIFF
ABSOLUTE BASO COUNT: 0.1 K/UL (ref 0–0.20)
ABSOLUTE EOS COUNT: 0.5 K/UL — ABNORMAL HIGH (ref 0–0.45)
WBC COUNT: 6 K/UL (ref 4.5–11.0)

## 2022-11-21 LAB — URINALYSIS DIPSTICK REFLEX TO CULTURE
NITRITE: NEGATIVE
URINE ASCORBIC ACID, UA: NEGATIVE
URINE BILE: NEGATIVE
URINE BLOOD: NEGATIVE

## 2022-11-21 LAB — COMPREHENSIVE METABOLIC PANEL
ALBUMIN: 4.2 g/dL (ref 3.5–5.0)
ALT: 40 U/L (ref 7–56)
ANION GAP: 6 K/UL (ref 3–12)
BLD UREA NITROGEN: 25 mg/dL (ref 7–25)
CALCIUM: 10 mg/dL (ref 8.5–10.6)
CHLORIDE: 102 MMOL/L (ref 98–110)
CREATININE: 0.9 mg/dL (ref 0.4–1.00)
GLUCOSE,PANEL: 93 mg/dL (ref 70–100)
SODIUM: 136 MMOL/L — ABNORMAL LOW (ref 137–147)
TOTAL BILIRUBIN: 0.5 mg/dL (ref 0.2–1.3)
TOTAL PROTEIN: 7.9 g/dL — ABNORMAL HIGH (ref 6.0–8.0)

## 2022-11-21 LAB — URINALYSIS MICROSCOPIC REFLEX TO CULTURE

## 2022-11-21 LAB — THYROID STIMULATING HORMONE-TSH: TSH: 2 uU/mL (ref 0.35–5.00)

## 2022-11-21 NOTE — Progress Notes
Date of Service: 11/21/2022      Subjective:             Reason for Visit:  Cancer      Molly Randolph is a 52 y.o. female.     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 In October 2015, she first noticed a depressed area superior to her left nipple, but thought that the finding was due to the weight loss and change in breast size. A month later, she noticed a mass in the same area. Bilateral diagnostic mammograms 06/07/14 ([place]) showed heterogeneously dense breast tissue. A spiculated mass was seen in the right breast 10-11:00 position, 2 cm FTN, anterior depth. There was an adjacent area architectural distortion seen only on CC tomosynthesis slice 39/64, slightly medial near 12:00, in the anterior central right breast. There were no suspicious masses, calcifications or architectural distortions in the left breast.  Right breast ultrasound 06/07/14 ([place]) showed a bilobed hypoechoic, irregular spiculated mass in the right breast extending from 11-12:00, 2 cm FTN with the dominant portion measuring 1.9 cm and a total inclusive distance of 3.4 cm. Right axillary survey demonstrates no suspicious adenopathy. BIRADS 5. On 07/05/14 she underwent a right breast biopsy that showed ILC, grade I, ER 90%, PR 98%, HER2 1+ IHC, Ki67 3%. On 08/04/14 Dr Nelson Chimes performed a right mastectomy and SLNB. Dr Fran Lowes placed a tissue expanders. Pathology showed ILC, grade 2, measuring 4.7cm.     She was seen on 09/11/16 for a new left breast mass. Left diagnostic mammogram 09/13/16 ([place]) revealed the area palpable concern as identified by the patient at 3:00, approximately 6-7 cm the nipple. At this location there was a subtle oval mass largely obscured by overlying glandular tissue. This measured approximately 1 cm but margins were poorly visualized. Further evaluation with ultrasound was performed. Targeted left breast ultrasound 09/13/16 ([place]) revealed at 3:00, 5 cm from nipple. At this location there was a lobulated hypoechoic mass with ill-defined margins measuring 1.3 cm x 0.8 cm x 1.1 cm. This was moderately suspicious and ultrasound-guided needle core biopsy was recommended. No axillary adenopathy was identified. On 09/14/16 left breast biopsy showed IDC, grade III, ER 0+, PR 0+, HER2 3+ IHC, Ki67 33%. She had left mastectomy and SLNB. Tumor was multifocal (1.7 cm, 0.2 cm, and multiple foci of microinvasion). 1/2 SLN was positive. ER/PR were 0% and HER2 was 3+ by IHC. She completed Taxotere/Carbo/Herceptin; 02/15/17.  She  completed radiation oncology 04/18/17 with Dr Clovis Riley. She stopped Herceptin 08/12/2017. Tamoxifen 20mg  daily started 03/2017. She reports stopping in 2019.     PRESENT THERAPY:  Observation.      She is here today for new sx.    She was last seen in clinic 10/27/2021     Review of Systems   Constitutional:  Positive for fatigue.   HENT:  Positive for trouble swallowing.    Genitourinary:         Malodorous urine    Musculoskeletal:  Positive for arthralgias.        Right breast pain    Psychiatric/Behavioral:  The patient is nervous/anxious.    All other systems reviewed and are negative.      No Known Allergies    Objective:          ALPRAZolam (XANAX) 1 mg tablet Take one tablet by mouth every 4 hours as needed for Anxiety.    diclofenac sodium (VOLTAREN) 1 % topical gel Apply four g topically to affected area four times daily.    HYDROcodone/acetaminophen (NORCO) 5/325 mg tablet Take one tablet by mouth every 6 hours as needed for Pain    ibuprofen (ADVIL) 200 mg tablet Take two tablets to four tablets by mouth every 6 hours as needed for Pain. Take with food.    lidocaine (LIDODERM) 5 % topical patch Apply one patch topically to affected area every 24 hours. Apply patch for 12 hours, then remove for 12 hours before repeating.    methocarbamoL (ROBAXIN) 750 mg tablet Take one tablet by mouth four times daily.       Body mass index is 27.44 kg/m?Marland Kitchen     Pain Score: Eight  Pain Loc: Generalized (joints, episodes of legs numbing, hospitalization for magnesium deficiency)      Pain Addressed:  Diagnostic test ordered    Patient Evaluated for a Clinical Trial: No treatment clinical trial available for this patient.     [place] Cooperative Oncology Group performance status is 1, Restricted in physically strenuous activity but ambulatory and able to carry out work of a light or sedentary nature, e.g., light house work, office work.     Physical Exam  Vitals and nursing note reviewed.   Constitutional:       Appearance: She is well-developed.   Pulmonary:      Effort: No respiratory distress.   Chest:   Breasts:     Right: Tenderness present. No mass or skin change.      Left: No mass, skin change or tenderness.       Lymphadenopathy:      Cervical: No cervical adenopathy.      Upper Body:      Right upper body: No supraclavicular or axillary adenopathy.      Left upper body: No supraclavicular or axillary adenopathy.          RADIOLOGY:  Left axilla ultrasound 03/22/2020: BIRAD 2-benign    Bone scan 08/02/2020:  Mild degenerative uptake seen involving the thoracolumbar spine, bilateral shoulders, sternoclavicular joints and hips. No scintigraphic evidence of osseous metastatic disease.     CT CAP 08/02/2020:   CHEST:   Prior bilateral mastectomies with right implant reconstruction. No thoracic metastatic disease.   ABDOMEN AND PELVIS:   1.  No abdominopelvic metastatic disease.   2.  Small left adnexal cystic lesion, which may represent a dominant follicle or functional cyst.       Right shoulder XR 08/29/2021:  1.  No acute fracture or dislocation is identified.   2.  Normal alignment. Glenohumeral and AC joint spaces are maintained.   3.  Surgical clips overlie the right chest wall. Visualized right lung field is clear. Surrounding soft tissues are unremarkable.     Elbow right XR 08/28/2021:  No acute finding.        Assessment and Plan:    1. Molly Tanori is a 51 year old post menopausal female with right breast ILC, grade I-II, measuring 4.7cm, ER/PR+ and HER2 negative.   2  Oncotype Dx RS was 10. The risk of 10 year distant recrrence on Tamoxifen is estimated to be 7%. Chemotherapy may provide an additional benefit of about 0% in terms to risk of recurrence. Chemotherapy was not recommended. She was started on Tamoxifen 20 mg 08/2014. She took tamoxifen off an on.   3. New left breast cancer 08/2016. On 09/14/16 left breast biopsy showed IDC, grade III, ER 0+, PR 0+, HER2 3+ IHC, Ki67 33%.  4. Left mastectomy and SLNB for a 1.7 cm cancer. I/2 SLN was positive.   5. Given the size and aggressive biology of the cancer, including HER2 positivity, we have recommended systemic chemotherapy.  We have recommended a treatment regimen of Taxotere, Carboplatin, and Herceptin x6; completed 02/15/17.  We planned to follow by Herceptin to complete a total treatment duration of 52 weeks. She stopped Herceptin 08/12/2017.   6. She was started on Tamoxifen 20mg  daily 03/2017. She reports stopping in 2019. Observation.   7. She completed radiation oncology 04/18/17 with Dr Clovis Riley.   8. Malodorous urine; UA today.   9. Fatigue; cbc and chem 12 and TSH today. CT CAP now.   10. Right breast pain under implant; MRI breast now.   11. Dysphagia; refer to GI. She would also like a colonoscopy. Refer to GI now.   12. RTC in one year.     My collaborating MD Dr Raul Del.     Total Time Today was 40 minutes in the following activities: Preparing to see the patient, Obtaining and/or reviewing separately obtained history, Performing a medically appropriate examination and/or evaluation, Counseling and educating the patient/family/caregiver, Ordering medications, tests, or procedures, and Documenting clinical information in the electronic or other health record    Myrla Halsted, APRN-NP

## 2022-11-23 ENCOUNTER — Encounter: Admit: 2022-11-23 | Discharge: 2022-11-23 | Payer: 59

## 2022-11-23 MED ORDER — LEVOFLOXACIN 500 MG PO TAB
500 mg | ORAL_TABLET | Freq: Every day | ORAL | 0 refills | 7.00000 days | Status: AC
Start: 2022-11-23 — End: ?

## 2022-11-23 NOTE — Telephone Encounter
Patient notified via Mychart message. KBolanos, RN

## 2022-11-23 NOTE — Telephone Encounter
-----   Message from Myrla Halsted, APRN-NP sent at 11/23/2022  8:46 AM CDT -----  [place]  ----- Message -----  From: Sammuel Cooper, RN  Sent: 11/23/2022   8:20 AM CDT  To: Selina Cooley, RN; Linden Dolin, RN; #    Her culture came back. Please advise.   Mabeline Caras  ----- Message -----  From: Mauri Brooklyn, In Sunquest Results  Sent: 11/22/2022  12:56 PM CDT  To: Marygrace Drought, MD

## 2022-11-28 ENCOUNTER — Encounter: Admit: 2022-11-28 | Discharge: 2022-11-28 | Payer: 59

## 2022-11-28 DIAGNOSIS — C50112 Malignant neoplasm of central portion of left female breast: Secondary | ICD-10-CM

## 2022-11-30 ENCOUNTER — Encounter: Admit: 2022-11-30 | Discharge: 2022-11-30 | Payer: 59

## 2022-11-30 NOTE — Progress Notes
Called patient to let her know that we need to get her set up for CT CAP as soon as able.   Message sent to [facility] to assist with the appointments and she will view the appointments through my chart.

## 2022-12-03 ENCOUNTER — Encounter: Admit: 2022-12-03 | Discharge: 2022-12-03 | Payer: 59

## 2022-12-07 ENCOUNTER — Encounter: Admit: 2022-12-07 | Discharge: 2022-12-07 | Payer: 59

## 2022-12-11 ENCOUNTER — Encounter: Admit: 2022-12-11 | Discharge: 2022-12-11 | Payer: 59

## 2022-12-11 DIAGNOSIS — F4310 Post-traumatic stress disorder, unspecified: Secondary | ICD-10-CM

## 2022-12-11 DIAGNOSIS — Z789 Other specified health status: Secondary | ICD-10-CM

## 2022-12-11 DIAGNOSIS — K620 Anal polyp: Secondary | ICD-10-CM

## 2022-12-11 DIAGNOSIS — C50112 Malignant neoplasm of central portion of left female breast: Secondary | ICD-10-CM

## 2022-12-11 DIAGNOSIS — N644 Mastodynia: Secondary | ICD-10-CM

## 2022-12-11 DIAGNOSIS — K5792 Diverticulitis of intestine, part unspecified, without perforation or abscess without bleeding: Secondary | ICD-10-CM

## 2022-12-11 DIAGNOSIS — M1990 Unspecified osteoarthritis, unspecified site: Secondary | ICD-10-CM

## 2022-12-11 DIAGNOSIS — K635 Polyp of colon: Secondary | ICD-10-CM

## 2022-12-11 DIAGNOSIS — M797 Fibromyalgia: Secondary | ICD-10-CM

## 2022-12-11 DIAGNOSIS — F32A Depression: Secondary | ICD-10-CM

## 2022-12-11 DIAGNOSIS — C50919 Malignant neoplasm of unspecified site of unspecified female breast: Secondary | ICD-10-CM

## 2022-12-11 MED ORDER — SODIUM CHLORIDE 0.9 % IJ SOLN
50 mL | Freq: Once | INTRAVENOUS | 0 refills | Status: CP
Start: 2022-12-11 — End: ?
  Administered 2022-12-11: 21:00:00 50 mL via INTRAVENOUS

## 2022-12-11 MED ORDER — IOHEXOL 350 MG IODINE/ML IV SOLN
100 mL | Freq: Once | INTRAVENOUS | 0 refills | Status: CP
Start: 2022-12-11 — End: ?
  Administered 2022-12-11: 21:00:00 100 mL via INTRAVENOUS

## 2022-12-11 MED ORDER — GADOBENATE DIMEGLUMINE 529 MG/ML (0.1MMOL/0.2ML) IV SOLN
15 mL | Freq: Once | INTRAVENOUS | 0 refills | Status: CP
Start: 2022-12-11 — End: ?
  Administered 2022-12-11: 21:00:00 15 mL via INTRAVENOUS

## 2022-12-13 ENCOUNTER — Encounter: Admit: 2022-12-13 | Discharge: 2022-12-13 | Payer: 59

## 2022-12-13 DIAGNOSIS — R2231 Localized swelling, mass and lump, right upper limb: Secondary | ICD-10-CM

## 2022-12-21 ENCOUNTER — Encounter: Admit: 2022-12-21 | Discharge: 2022-12-21 | Payer: 59

## 2022-12-21 DIAGNOSIS — F32A Depression: Secondary | ICD-10-CM

## 2022-12-21 DIAGNOSIS — R2231 Localized swelling, mass and lump, right upper limb: Secondary | ICD-10-CM

## 2022-12-21 DIAGNOSIS — K620 Anal polyp: Secondary | ICD-10-CM

## 2022-12-21 DIAGNOSIS — M1990 Unspecified osteoarthritis, unspecified site: Secondary | ICD-10-CM

## 2022-12-21 DIAGNOSIS — C50919 Malignant neoplasm of unspecified site of unspecified female breast: Secondary | ICD-10-CM

## 2022-12-21 DIAGNOSIS — Z789 Other specified health status: Secondary | ICD-10-CM

## 2022-12-21 DIAGNOSIS — K635 Polyp of colon: Secondary | ICD-10-CM

## 2022-12-21 DIAGNOSIS — M797 Fibromyalgia: Secondary | ICD-10-CM

## 2022-12-21 DIAGNOSIS — K5792 Diverticulitis of intestine, part unspecified, without perforation or abscess without bleeding: Secondary | ICD-10-CM

## 2022-12-21 DIAGNOSIS — F4310 Post-traumatic stress disorder, unspecified: Secondary | ICD-10-CM

## 2022-12-26 ENCOUNTER — Encounter: Admit: 2022-12-26 | Discharge: 2022-12-26 | Payer: 59

## 2022-12-26 NOTE — Progress Notes
Informed patient of negative right axilla [place]. Patient to call back if symptoms worsen or any concerns. Patient voiced understanding. KBolanos, RN

## 2023-08-22 ENCOUNTER — Encounter: Admit: 2023-08-22 | Discharge: 2023-08-22 | Payer: PRIVATE HEALTH INSURANCE

## 2023-08-22 ENCOUNTER — Emergency Department
Admit: 2023-08-22 | Discharge: 2023-08-23 | Disposition: A | Discharge status: Home / Self Care | Payer: PRIVATE HEALTH INSURANCE

## 2023-08-22 ENCOUNTER — Emergency Department: Admit: 2023-08-22 | Discharge: 2023-08-22 | Payer: PRIVATE HEALTH INSURANCE

## 2023-09-05 ENCOUNTER — Encounter: Admit: 2023-09-05 | Discharge: 2023-09-05

## 2023-09-06 ENCOUNTER — Ambulatory Visit: Admit: 2023-09-06 | Discharge: 2023-09-07

## 2023-09-06 ENCOUNTER — Encounter: Admit: 2023-09-06 | Discharge: 2023-09-06

## 2023-09-09 ENCOUNTER — Encounter: Admit: 2023-09-09 | Discharge: 2023-09-09

## 2023-09-10 ENCOUNTER — Encounter: Admit: 2023-09-10 | Discharge: 2023-09-10

## 2023-10-07 ENCOUNTER — Encounter: Admit: 2023-10-07 | Discharge: 2023-10-07

## 2023-10-07 ENCOUNTER — Ambulatory Visit: Admit: 2023-10-07 | Discharge: 2023-10-08

## 2023-10-09 ENCOUNTER — Encounter: Admit: 2023-10-09 | Discharge: 2023-10-09

## 2023-10-09 NOTE — Telephone Encounter
 Social Work Case Management Note    Name:  [name] #:  [id number]    Date:  10/09/2023                                                                     Telephone #:  [phone] (home)    Patient's Address:    [address]  [place] [address]         PLAN/ INTERVENTIONS:     Pt, Molly Randolph, was referred to SW by Dr Lincoln Renshaw for assistance with financial questions/concerns if pt is off work for approximately 6 weeks following a recommended Gyn surgery. [name] is 53 yrs old and was seen by Dr Lincoln Renshaw on 10/07/23 in Gyn clinic for AUB/PMB . It is being recommended that the pt consider hysterectomy.      SW attempted to reach [name] by phone this date in follow up to discuss her questions about financial assistance that might be available to her during an extended medical leave from her job.  SW did not reach [name], but left a brief voicemail message with SW name/number and requested a return call.    SW will remain available to assist further as needed.                         Michelina Aho, LSCSW   Social Work Sports coach  On Teams  Office: [phone]

## 2023-10-10 ENCOUNTER — Encounter: Admit: 2023-10-10 | Discharge: 2023-10-10

## 2023-10-11 ENCOUNTER — Encounter: Admit: 2023-10-11 | Discharge: 2023-10-11 | Payer: PRIVATE HEALTH INSURANCE

## 2023-10-11 NOTE — Telephone Encounter
 Social Work Case Management Note    Name:  Nichele Slawson #:  [id number]    Date:  10/11/2023                                                                     Telephone #:  [phone] (home)    Patient's Address:    [address]  [place] [address]         PLAN/INTERVENTIONS:     Pt , Shaquera Ansley, contacted SW this date by phone to discuss options for accessing short term disability benefits in anticipation of being scheduled for a Gyn surgery with Dr Lincoln Renshaw.   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 However, she is concerned that due to taking time off from work for other health concerns in the last year, she doesn't have enough to take time off for a surgery. She is concerned that if she uses unpaid FMLA that she will not be able to manage her bills/financial obligations.    SW inquired about the possibility of Ms Coulibaly having Short Term Disability benefits through her job that would provide her with at least a partial salary during a medical leave.  Ms Tomasini stated she wasn't sure if that was available to her, but she plans to speak with her union steward to explore that option.    SW encouraged the pt to reach out to Dr Anselmo Bast nursing staff with any questions about medical recommendations or to discuss completion of STD paperwork if she is eligible for that program.      No other needs/concerns identified by pt at this time. SW will remain available to assist further as needed.                           Michelina Aho, LSCSW   Social Work Sports coach  On Teams  Office: [phone]

## 2023-10-17 ENCOUNTER — Encounter: Admit: 2023-10-17 | Discharge: 2023-10-17 | Payer: PRIVATE HEALTH INSURANCE

## 2023-10-17 DIAGNOSIS — N950 Postmenopausal bleeding: Secondary | ICD-10-CM

## 2023-10-17 NOTE — Telephone Encounter
 Pt called and LVM stating that she is needing to schedule surgery and needing a letter from her doctor for work. I am trying to get them to advance my sick leave so I can have the surgery done.    Forwarding to Dr. Lincoln Renshaw and Vicci Graff for assistance.

## 2023-10-17 NOTE — Telephone Encounter
 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     Discussed HSC vs hysterectomy. Patient strongly desires hysterectomy. Given risk of uterine cancer and incomplete evaluation, plan for gyn onc referral to discuss best management (frozen section etc).     She strongly desires to avoid an additional surgery if possible. She has sent in her paperwork with her work for her surgeon to fill out re: sick leave and time off. Counseled to send this to her provider when she gets scheduled     Feliciano Horner, MD

## 2023-10-18 ENCOUNTER — Encounter: Admit: 2023-10-18 | Discharge: 2023-10-18 | Payer: PRIVATE HEALTH INSURANCE

## 2023-10-31 ENCOUNTER — Encounter: Admit: 2023-10-31 | Discharge: 2023-10-31 | Payer: PRIVATE HEALTH INSURANCE

## 2023-11-05 ENCOUNTER — Encounter: Admit: 2023-11-05 | Discharge: 2023-11-05 | Payer: PRIVATE HEALTH INSURANCE

## 2023-11-06 ENCOUNTER — Encounter: Admit: 2023-11-06 | Discharge: 2023-11-06 | Payer: PRIVATE HEALTH INSURANCE

## 2023-11-06 DIAGNOSIS — N939 Abnormal uterine and vaginal bleeding, unspecified: Secondary | ICD-10-CM

## 2023-11-06 MED ORDER — SCOPOLAMINE BASE 1 MG OVER 3 DAYS TD PT3D
1 | TRANSDERMAL | 0 refills
Start: 2023-11-06 — End: ?

## 2023-11-06 MED ORDER — METRONIDAZOLE IN NACL (ISO-OS) 500 MG/100 ML IV PGBK
500 mg | Freq: Once | INTRAVENOUS | 0 refills
Start: 2023-11-06 — End: ?

## 2023-11-06 MED ORDER — GABAPENTIN 300 MG PO CAP
600 mg | Freq: Once | ORAL | 0 refills
Start: 2023-11-06 — End: ?

## 2023-11-06 MED ORDER — CELECOXIB 100 MG PO CAP
200 mg | Freq: Once | ORAL | 0 refills
Start: 2023-11-06 — End: ?

## 2023-11-06 MED ORDER — HEPARIN (PORCINE) 10,000 UNIT/ML IJ SOLN (IMS)
5000 [IU] | Freq: Once | SUBCUTANEOUS | 0 refills
Start: 2023-11-06 — End: ?

## 2023-11-06 MED ORDER — CEFAZOLIN 2 GRAM IV SOLR
2 g | Freq: Once | INTRAVENOUS | 0 refills
Start: 2023-11-06 — End: ?

## 2023-11-06 MED ORDER — PRESURGERY KIT C
PACK | ORAL | 0 refills | 1.00000 days | Status: AC
Start: 2023-11-06 — End: ?
  Filled 2023-11-07: qty 1, 1d supply, fill #1

## 2023-11-06 MED ORDER — ACETAMINOPHEN 500 MG PO TAB
1000 mg | Freq: Once | ORAL | 0 refills
Start: 2023-11-06 — End: ?

## 2023-11-06 MED ORDER — PATCH DOCUMENTATION - SCOPOLAMINE BASE 1 MG/72HR
1 | Freq: Two times a day (BID) | TRANSDERMAL | 0 refills
Start: 2023-11-06 — End: ?

## 2023-11-07 ENCOUNTER — Encounter: Admit: 2023-11-07 | Discharge: 2023-11-07 | Payer: PRIVATE HEALTH INSURANCE

## 2023-11-08 ENCOUNTER — Encounter: Admit: 2023-11-08 | Discharge: 2023-11-08 | Payer: PRIVATE HEALTH INSURANCE

## 2023-11-08 ENCOUNTER — Ambulatory Visit: Admit: 2023-11-08 | Discharge: 2023-11-08 | Payer: PRIVATE HEALTH INSURANCE

## 2023-11-08 MED ORDER — EPHEDRINE SULFATE 50 MG/ML IV SOLN
INTRAVENOUS | 0 refills | Status: DC
Start: 2023-11-08 — End: 2023-11-08

## 2023-11-08 MED ORDER — HYDROMORPHONE (PF) 2 MG/ML IJ SYRG
INTRAVENOUS | 0 refills | Status: DC
Start: 2023-11-08 — End: 2023-11-08

## 2023-11-08 MED ORDER — LIDOCAINE (PF) 200 MG/10 ML (2 %) IJ SYRG
INTRAVENOUS | 0 refills | Status: DC
Start: 2023-11-08 — End: 2023-11-08

## 2023-11-08 MED ORDER — SUGAMMADEX 100 MG/ML IV SOLN
INTRAVENOUS | 0 refills | Status: DC
Start: 2023-11-08 — End: 2023-11-08

## 2023-11-08 MED ORDER — ONDANSETRON HCL (PF) 4 MG/2 ML IJ SOLN
INTRAVENOUS | 0 refills | Status: DC
Start: 2023-11-08 — End: 2023-11-08

## 2023-11-08 MED ORDER — FENTANYL CITRATE (PF) 50 MCG/ML IJ SOLN
INTRAVENOUS | 0 refills | Status: DC
Start: 2023-11-08 — End: 2023-11-08

## 2023-11-08 MED ORDER — DEXAMETHASONE SODIUM PHOSPHATE 4 MG/ML IJ SOLN
INTRAVENOUS | 0 refills | Status: DC
Start: 2023-11-08 — End: 2023-11-08

## 2023-11-08 MED ORDER — ARTIFICIAL TEARS (PF) SINGLE DOSE DROPS GROUP
OPHTHALMIC | 0 refills | Status: DC
Start: 2023-11-08 — End: 2023-11-08

## 2023-11-08 MED ORDER — PHENYLEPHRINE HCL IN 0.9% NACL 1 MG/10 ML (100 MCG/ML) IV SYRG
INTRAVENOUS | 0 refills | Status: DC
Start: 2023-11-08 — End: 2023-11-08

## 2023-11-08 MED ORDER — ROCURONIUM 10 MG/ML IV SOLN
INTRAVENOUS | 0 refills | Status: DC
Start: 2023-11-08 — End: 2023-11-08

## 2023-11-08 MED ORDER — PROPOFOL INJ 10 MG/ML IV VIAL
INTRAVENOUS | 0 refills | Status: DC
Start: 2023-11-08 — End: 2023-11-08

## 2023-11-08 MED ADMIN — SCOPOLAMINE BASE 1 MG OVER 3 DAYS TD PT3D [82141]: 1 | TRANSDERMAL | @ 15:00:00 | Stop: 2023-11-08 | NDC 50742050501

## 2023-11-08 MED ADMIN — CELECOXIB 200 MG PO CAP [76958]: 200 mg | ORAL | @ 14:00:00 | Stop: 2023-11-08 | NDC 60687044711

## 2023-11-08 MED ADMIN — SCOPOLAMINE BASE 1 MG OVER 3 DAYS TD PT3D [82141]: 1 | TRANSDERMAL | @ 15:00:00 | Stop: 2023-11-08 | NDC 10019055390

## 2023-11-08 MED ADMIN — HYDROMORPHONE (PF) 2 MG/ML IJ SYRG [163476]: 0.2 mg | INTRAVENOUS | @ 18:00:00 | Stop: 2023-11-08 | NDC 63323085303

## 2023-11-08 MED ADMIN — HEPARIN, PORCINE (PF) 5,000 UNIT/0.5 ML IJ SYRG [95535]: 5000 [IU] | SUBCUTANEOUS | @ 15:00:00 | Stop: 2023-11-08 | NDC 63323054303

## 2023-11-08 MED ADMIN — FENTANYL CITRATE (PF) 50 MCG/ML IJ SOLN [3037]: 50 ug | INTRAVENOUS | @ 18:00:00 | Stop: 2023-11-08 | NDC 00409909412

## 2023-11-08 MED ADMIN — LACTATED RINGERS IV SOLP [4318]: 1000 mL | INTRAVENOUS | @ 15:00:00 | Stop: 2023-11-08 | NDC 00338011704

## 2023-11-08 MED ADMIN — METRONIDAZOLE IN NACL (ISO-OS) 500 MG/100 ML IV PGBK [5018]: 500 mg | INTRAVENOUS | @ 15:00:00 | Stop: 2023-11-08 | NDC 00338105548

## 2023-11-08 MED ADMIN — METHOCARBAMOL 100 MG/ML IJ SOLN [4970]: 1000 mg | INTRAVENOUS | @ 19:00:00 | Stop: 2023-11-08 | NDC 71288071611

## 2023-11-08 MED ADMIN — GABAPENTIN 300 MG PO CAP [18308]: 600 mg | ORAL | @ 14:00:00 | Stop: 2023-11-08 | NDC 67877022305

## 2023-11-08 MED ADMIN — HYDROMORPHONE (PF) 2 MG/ML IJ SYRG [163476]: 0.2 mg | INTRAVENOUS | @ 19:00:00 | Stop: 2023-11-08 | NDC 63323085303

## 2023-11-08 MED ADMIN — OXYCODONE 5 MG PO TAB [10814]: 5 mg | ORAL | @ 18:00:00 | Stop: 2023-11-08 | NDC 00904696661

## 2023-11-08 MED ADMIN — ACETAMINOPHEN 500 MG PO TAB [102]: 1000 mg | ORAL | @ 14:00:00 | Stop: 2023-11-08 | NDC 50580045711

## 2023-11-08 MED ADMIN — CEFAZOLIN 2 GRAM IV SOLR [462609]: 2 g | INTRAVENOUS | @ 16:00:00 | Stop: 2023-11-08 | NDC 00143913901

## 2023-11-08 MED FILL — OXYCODONE 5 MG PO TAB: 5 mg | ORAL | 3 days supply | Qty: 10 | Fill #1 | Status: CP

## 2023-11-08 MED FILL — ONDANSETRON 4 MG PO TBDI: 4 mg | ORAL | 10 days supply | Qty: 30 | Fill #1 | Status: CP

## 2023-11-08 NOTE — Anesthesia Procedure Notes
 Procedure: Airway Placement    AIRWAY INSERTION    Date/Time: 11/08/2023 10:34 AM    Patient location: OR  Urgency: elective  Difficult Airway: No            Airway Procedure  Indication(s) for airway management: surgery        no    Preoxygenated: yes  Patient position: sniffing    Mask difficulty assessment: 1 - vent by mask      Procedure Outcome  Final airway type: endotracheal airway  Endotracheal airway: ETT          ETT size (mm): 7.0  Technique used for successful ETT placement: video laryngoscopy  Devices/methods used in placement: intubating stylet  Insertion site: oral  Blade type: Glide Scope   Laryngoscope/Videolaryngoscope blade size: 3  Cormack-Lehane classification: grade I - full view of glottis      Measured from: lips   Depth: 21 cm  Amount of Air in Cuff: 8 ml  Number of attempts at approach: 1  Placement verified by auscultation and capnometry          Additional notes: Atraumatic, dentition and oral mucosa unchanged    Intubation by RT student, glidescope used for learning purposes      Performed by: Kovac, Anthony L, MD  Authorized by: Kovac, Anthony L, MD

## 2023-11-08 NOTE — Anesthesia Post-Procedure Evaluation
 Post-Anesthesia Evaluation    Name: Molly Randolph      MRN: [id number]     DOB: 12/27/1970     Age: 53 y.o.     Sex: female   __________________________________________________________________________     Procedure Information       Anesthesia Start Date/Time: 11/08/23 1020    Procedure: ROBOT ASSISTED LAPAROSCOPIC TOTAL HYSTERECTOMY WITH REMOVAL OF TUBE/ OVARY - UTERUS 250 G OR LESS (Bilateral: Pelvis)    Location: MAIN OR 31 / Main OR/Periop    Surgeons: Joe Murders, MD            Post-Anesthesia Vitals  BP: 164/89 (05/09 1431)  Temp: 36.5 ?C (97.7 ?F) (05/09 1431)  Pulse: 82 (05/09 1431)  Respirations: 11 PER MINUTE (05/09 1431)  SpO2: 95 % (05/09 1431)  O2 Device: None (Room air) (05/09 1430)   Vitals Value Taken Time   BP 164/89 11/08/23 14:31   Temp 36.5 ?C (97.7 ?F) 11/08/23 14:31   Pulse 82 11/08/23 14:31   Respirations 11 PER MINUTE 11/08/23 14:31   SpO2 95 % 11/08/23 14:31   O2 Device None (Room air) 11/08/23 14:30   ABP     ART BP           Post Anesthesia Evaluation Note    Evaluation location: Pre/Post  Patient participation: recovered; patient participated in evaluation  Level of consciousness: alert    Pain score: 4  Pain management: adequate    Hydration: normovolemia  Temperature: 36.0?C - 38.4?C  Airway patency: adequate    Perioperative Events       Post-op nausea and vomiting: no PONV    Postoperative Status  Cardiovascular status: hemodynamically stable and hypertensive (at baseline)  Respiratory status: spontaneous ventilation  Follow-up needed: none        Perioperative Events  There were no known complications for this encounter.

## 2023-11-10 ENCOUNTER — Encounter: Admit: 2023-11-10 | Discharge: 2023-11-10 | Payer: PRIVATE HEALTH INSURANCE

## 2023-12-17 ENCOUNTER — Encounter: Admit: 2023-12-17 | Discharge: 2023-12-17 | Payer: PRIVATE HEALTH INSURANCE

## 2023-12-22 ENCOUNTER — Encounter: Admit: 2023-12-22 | Discharge: 2023-12-22 | Payer: PRIVATE HEALTH INSURANCE

## 2023-12-22 NOTE — Telephone Encounter
 Called patient to offer earlier appointment for postop scheduled for 6/23. Offered 11 or 2:30. No answer. LMTCB.

## 2023-12-23 ENCOUNTER — Encounter: Admit: 2023-12-23 | Discharge: 2023-12-23 | Payer: PRIVATE HEALTH INSURANCE
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
## 2024-01-01 ENCOUNTER — Encounter: Admit: 2024-01-01 | Discharge: 2024-01-01 | Payer: PRIVATE HEALTH INSURANCE

## 2024-01-01 DIAGNOSIS — N939 Abnormal uterine and vaginal bleeding, unspecified: Principal | ICD-10-CM

## 2024-02-22 ENCOUNTER — Encounter: Admit: 2024-02-22 | Discharge: 2024-02-22 | Payer: PRIVATE HEALTH INSURANCE
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
## 2024-02-24 ENCOUNTER — Encounter: Admit: 2024-02-24 | Discharge: 2024-02-24 | Payer: PRIVATE HEALTH INSURANCE

## 2024-02-24 ENCOUNTER — Emergency Department: Admit: 2024-02-24 | Discharge: 2024-02-24 | Payer: PRIVATE HEALTH INSURANCE

## 2024-02-24 ENCOUNTER — Encounter: Admit: 2024-02-24 | Discharge: 2024-02-25 | Payer: PRIVATE HEALTH INSURANCE

## 2024-02-24 ENCOUNTER — Ambulatory Visit: Admit: 2024-02-24 | Discharge: 2024-02-24 | Payer: PRIVATE HEALTH INSURANCE
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
## 2024-02-24 NOTE — Telephone Encounter
 Patient called and left a VM that she is having more pain. RN called and spoke to patient. Patient is 14 weeks post op who called the [facility] r/t bleeding during intercourse and lower abdominal pain starting on 8/23. Per patient, she is not feeling better but actually worse to where the pain in not being controlled by OTC or prescribed pain medications. Patient stated that she did have a lot of blood after intercourse on 8/23 but has not have bleeding. Patient was not sure at this time if she has had any discharge. Patient has only had sips with medication this AM when she was taking her pain medication. RN instructed patient to not eat or drink anything until after someone from our team can see her in the ER. Patient verbalized understanding and stated that she was on her way to the ER now. RN gave nurses line for patient to call back with any other question/concerns.     12:20 RN called and spoke to Dr. Bluford letting him and the inpatient team know that patient was to be arriving in about 15-69mins to ER with concern for dehiscence.     1225:RN called resident room to let them know of patient's ETA arrival to ER.

## 2024-02-25 ENCOUNTER — Encounter: Admit: 2024-02-25 | Discharge: 2024-02-25 | Payer: PRIVATE HEALTH INSURANCE

## 2024-02-26 ENCOUNTER — Encounter: Admit: 2024-02-26 | Discharge: 2024-02-26 | Payer: PRIVATE HEALTH INSURANCE

## 2024-03-05 ENCOUNTER — Encounter: Admit: 2024-03-05 | Discharge: 2024-03-05 | Payer: PRIVATE HEALTH INSURANCE

## 2024-03-25 ENCOUNTER — Encounter: Admit: 2024-03-25 | Discharge: 2024-03-25 | Payer: PRIVATE HEALTH INSURANCE

## 2024-03-26 ENCOUNTER — Encounter: Admit: 2024-03-26 | Discharge: 2024-03-26 | Payer: PRIVATE HEALTH INSURANCE

## 2024-04-05 ENCOUNTER — Encounter: Admit: 2024-04-05 | Discharge: 2024-04-05 | Payer: PRIVATE HEALTH INSURANCE

## 2024-04-22 ENCOUNTER — Encounter: Admit: 2024-04-22 | Discharge: 2024-04-22 | Payer: PRIVATE HEALTH INSURANCE

## 2024-04-22 NOTE — Research Notes
 Re-Consent Letter  Study # [id number]    Participant was mailed a letter to indicate the PI for the above study has been changed from Ludie Boor, MD, PhD to Recardo Pizza, MD on 04/22/2024 to the address listed in EPIC.

## 2024-05-14 ENCOUNTER — Encounter: Admit: 2024-05-14 | Discharge: 2024-05-14 | Payer: PRIVATE HEALTH INSURANCE

## 2024-06-30 ENCOUNTER — Encounter: Admit: 2024-06-30 | Discharge: 2024-06-30 | Payer: PRIVATE HEALTH INSURANCE

## 2024-07-10 ENCOUNTER — Encounter: Admit: 2024-07-10 | Discharge: 2024-07-10 | Payer: PRIVATE HEALTH INSURANCE
